# Patient Record
Sex: Female | Born: 1950
Health system: Southern US, Community
[De-identification: ages and names within clinical notes are randomized; demographics above are authoritative.]

## PROBLEM LIST (undated history)

## (undated) DIAGNOSIS — K219 Gastro-esophageal reflux disease without esophagitis: Secondary | ICD-10-CM

## (undated) DIAGNOSIS — E785 Hyperlipidemia, unspecified: Secondary | ICD-10-CM

## (undated) DIAGNOSIS — G5602 Carpal tunnel syndrome, left upper limb: Secondary | ICD-10-CM

## (undated) DIAGNOSIS — M1990 Unspecified osteoarthritis, unspecified site: Secondary | ICD-10-CM

## (undated) DIAGNOSIS — F419 Anxiety disorder, unspecified: Secondary | ICD-10-CM

## (undated) DIAGNOSIS — J302 Other seasonal allergic rhinitis: Secondary | ICD-10-CM

## (undated) HISTORY — DX: Anxiety disorder, unspecified: F41.9

## (undated) HISTORY — PX: TONSILLECTOMY: SUR1361

## (undated) HISTORY — DX: Hyperlipidemia, unspecified: E78.5

## (undated) HISTORY — DX: Other seasonal allergic rhinitis: J30.2

## (undated) HISTORY — DX: Carpal tunnel syndrome, left upper limb: G56.02

## (undated) HISTORY — PX: ABDOMINAL HYSTERECTOMY: SHX81

---

## 2001-06-22 ENCOUNTER — Other Ambulatory Visit: Admission: RE | Admit: 2001-06-22 | Discharge: 2001-06-22 | Payer: Self-pay | Admitting: Unknown Physician Specialty

## 2005-02-22 LAB — HM COLONOSCOPY: HM Colonoscopy: NORMAL

## 2010-08-27 HISTORY — PX: HAND SURGERY: SHX662

## 2011-04-01 ENCOUNTER — Emergency Department (HOSPITAL_COMMUNITY)
Admission: EM | Admit: 2011-04-01 | Discharge: 2011-04-02 | Disposition: A | Discharge status: Home Health | Payer: 59 | Attending: Emergency Medicine | Admitting: Emergency Medicine

## 2011-04-01 ENCOUNTER — Encounter (HOSPITAL_COMMUNITY): Payer: Self-pay | Admitting: Emergency Medicine

## 2011-04-01 DIAGNOSIS — R059 Cough, unspecified: Secondary | ICD-10-CM | POA: Insufficient documentation

## 2011-04-01 DIAGNOSIS — H9319 Tinnitus, unspecified ear: Secondary | ICD-10-CM | POA: Insufficient documentation

## 2011-04-01 DIAGNOSIS — H539 Unspecified visual disturbance: Secondary | ICD-10-CM | POA: Insufficient documentation

## 2011-04-01 DIAGNOSIS — H43391 Other vitreous opacities, right eye: Secondary | ICD-10-CM

## 2011-04-01 DIAGNOSIS — R05 Cough: Secondary | ICD-10-CM | POA: Insufficient documentation

## 2011-04-01 DIAGNOSIS — Z90710 Acquired absence of both cervix and uterus: Secondary | ICD-10-CM | POA: Insufficient documentation

## 2011-04-01 NOTE — Discharge Instructions (Signed)
Eye Floaters A jelly-like fluid fills the inside of the eye and is called the vitreous. The vitreous is normally clear. It allows light to pass through to the back of the eye to the tissues that contain the nerves needed for vision (the retina). With age, the vitreous can start to decline. If a decline happens, specks of material from clumps of cells, blood, or other materials may start to float around inside the eye. These objects cast shadows on the retina. These shadows are seen as moving strings, streaks, "bugs," dust or spider webs floating in front of the eye. CAUSES   Age.   A high degree of near-sightedness (high myopia).   Tears in the retina.   Bleeding inside the eye from broken retinal blood vessels as a result of disease (diabetes, inflammation of the retinal blood vessels, and others).   Blood clot of the major vein of the retina or its branches (retinal vein occlusion).   Trauma.   Retinal detachment.   Vitreous detachment.   Eye surgery.   Inflammation inside the eye (uveitis).   Infection inside the eye.  SYMPTOMS   Seeing floating specs, dots or spider webs in the vision of one eye. This can sometimes be associated with flashes of light seen off to the side.   Bleeding in the eye may begin as floaters and lead to complete vision loss as the vitreous fills with blood. This may happen repeatedly in certain diseases of the blood vessels of the retina (e.g. diabetes).   If the vitreous shrinks enough to pull away from the retina (posterior vitreous detachment), a small circular ring-shaped floater may be seen.  Migraine headaches may be associated with many forms of visual symptoms (sparkling dots, wavy lines) just before the headache strikes. These symptoms due to migraine are not from floaters. They will disappear when the headache goes away. DIAGNOSIS  An eye professional can tell you if you have floaters during an eye exam. TREATMENT  There is no treatment for  the floaters themselves.  If the floaters are due to a tear in the retina, a retinal detachment or other eye disease, the condition that caused the floaters must be treated.   Floaters due to blood in the eye often go away or lessen with time.  SEEK MEDICAL CARE IF:   You suddenly see floating dots or spider webs in front of the vision of one or both eyes. This is especially true if you also see flashes of light off to the side (like flashes of lightening).   You see floaters and also notice a change or drop in your vision in either eye.  Document Released: 01/15/2003 Document Revised: 01/01/2011 Document Reviewed: 05/12/2007 [facility] Patient Information 2012 [place], [place].Vitreous Detachment Most of the eye's interior is filled with vitreous. This is a clear, gel-like substance that helps the eye maintain a round shape. There are millions of fine fibers intertwined within the vitreous that are attached to the surface of the retina (eye's light-sensitive tissue). As we age, the vitreous slowly shrinks, and these fine fibers pull on the retinal surface. Usually the fibers break, allowing the vitreous to separate and shrink away from the retina. This is called a vitreous detachment. In most cases, a vitreous detachment is not sight-threatening. It requires no treatment. A vitreous detachment is a common condition that usually affects people over age 50. It is even more common after age 76. People who are nearsighted are also at increased risk. Those who  have a vitreous detachment in one eye are likely to have one in the other, but it may not happen until years later. SYMPTOMS  As the vitreous shrinks, it becomes somewhat stringy. These strands can cast tiny shadows on the retina. They are seen as "cobwebs" or specks that seem to float about in your field of vision (floaters). If you try to look at these shadows, they appear to quickly dart out of the way. One symptom of a vitreous detachment is a  small, sudden increase in the number of new floaters. This increase in floaters may be accompanied by flashes of light (lightning streaks) in your side (peripheral) vision. In most cases, either you will not notice a vitreous detachment, or you will find it merely annoying due to the increase in floaters. If the vitreous pulls away from the very back of the eye where it is attached to the optic nerve, you may see a small round circle floating within your visual field. This is from the ring of fiber that held the vitreous to the edges of the optic nerve, but has been pulled away. TREATMENT  A vitreous detachment itself does not threaten sight. Occasionally, however, some of the vitreous fibers pull so hard on the retina that they create a hole or tear in the retina. This may lead to a serious condition called a retinal detachment. If the hole occurs at the very back of the eye where all of the light for detailed vision is focused (macula), the ability to see detail clearly may be lost. Both of these conditions are sight-threatening and should be evaluated immediately.  DIAGNOSIS  If the vitreous detachment has led to a retinal tear, hole, or a detached retina, early treatment can help prevent loss of vision. However, macular holes may not be treatable. If left untreated, a detached retina can lead to permanent vision loss in the affected eye. Those who experience a sudden increase in floaters or an increase in flashes of light in peripheral vision should have an eye care specialist examine their eyes as soon as possible. If a portion of your visual field in one eye is completely black, like a curtain, you may have had a retinal detachment. The only way to diagnose the cause of the problem is to have a complete dilated eye exam (medicines are put in the eye to make the black circle, the pupil, larger) by an eye specialist. Document Released: 01/15/2003 Document Revised: 01/01/2011 Document Reviewed:  12/03/2008 [facility] Patient Information 2012 [place], [place].

## 2011-04-01 NOTE — ED Notes (Signed)
Patient reports that she had a cold since past Wednesday;. Around 1900 tonight, patient saw flashing lights in the side of her right eye.  Reports flashing of light in a "arch shape".  Patient referred here from PCP to rule out possible retinal detachment.

## 2011-04-01 NOTE — ED Notes (Signed)
EDP at bedside  

## 2011-04-01 NOTE — ED Provider Notes (Signed)
History     CSN: [id number]  Arrival date & time 04/01/11  2220   First MD Initiated Contact with Patient 04/01/11 2322      No chief complaint on file.   (Consider location/radiation/quality/duration/timing/severity/associated sxs/prior treatment) The history is provided by the patient.   patient has had a cough for the last few days. She states around 7:00 tonight she began seeing some flashing lights in the upper outer vision of her right eye. It is in an arch-shaped. She states she was sent in by her doctor to rule out retinal detachment. She had some floaters in the right eye yesterday, but those are resolved. No vision changes. No eye pain. She wears reading glasses. No headaches.  Past Medical History  Diagnosis Date  . Tinnitus     Past Surgical History  Procedure Date  . Hand surgery   . Abdominal hysterectomy   . Cesarean section   . Tonsillectomy     History reviewed. No pertinent family history.  History  Substance Use Topics  . Smoking status: Never Smoker   . Smokeless tobacco: Not on file  . Alcohol Use: No    OB History    Grav Para Term Preterm Abortions TAB SAB Ect Mult Living                  Review of Systems  Constitutional: Negative for fatigue.  HENT: Negative for hearing loss, neck pain and neck stiffness.   Eyes: Positive for visual disturbance. Negative for photophobia, pain, discharge, redness and itching.  Respiratory: Positive for cough.     Allergies  Review of patient's allergies indicates no known allergies.  Home Medications   Current Outpatient Rx  Name Route Sig Dispense Refill  . ALPRAZOLAM 0.5 MG PO TABS Oral Take 0.25 mg by mouth 2 (two) times daily.    . AZITHROMYCIN 250 MG PO TABS Oral Take 250 mg by mouth daily. Take until 04/03/11    . BIOTIN PO Oral Take 600 mcg by mouth daily.    Marland Kitchen VITAMIN D3 1000 UNITS PO TABS Oral Take 1,000 Units by mouth daily.    Marland Kitchen CLOBETASOL PROPIONATE 0.05 % EX SOLN Topical Apply 1  application topically 2 (two) times a week.    Marland Kitchen ESTRADIOL 0.1 MG/GM VA CREA Vaginal Place 2 g vaginally daily.    Marland Kitchen HYDROCODONE-HOMATROPINE 5-1.5 MG/5ML PO SYRP Oral Take 5 mLs by mouth at bedtime as needed. For cough    . METRONIDAZOLE 0.75 % EX CREA Topical Apply 1 application topically 2 (two) times daily. To face    . ADULT MULTIVITAMIN W/MINERALS CH Oral Take 1 tablet by mouth daily.    . OMEGA-3-ACID ETHYL ESTERS 1 G PO CAPS Oral Take 4 g by mouth daily.    . RED YEAST RICE 600 MG PO CAPS Oral Take 1 capsule by mouth daily.    Marland Kitchen LIPO-FLAVONOID PLUS PO Oral Take 3 tablets by mouth daily.      BP 162/88  Pulse 88  Temp(Src) 98.5 F (36.9 C) (Oral)  Resp 16  SpO2 98%  Physical Exam  Constitutional: She appears well-developed.  HENT:  Head: Normocephalic and atraumatic.  Eyes: Conjunctivae are normal. Pupils are equal, round, and reactive to light. Right eye exhibits no discharge. Left eye exhibits no discharge.       Visual fields intact bilaterally to confrontation. Normal visual acuity.  Neck: Normal range of motion. Neck supple.  Cardiovascular: Normal rate.   Pulmonary/Chest: Effort normal.  ED Course  Procedures (including critical care time)  Labs Reviewed - No data to display No results found.   1. Flashers or floaters of right eye       MDM  Patient with flashes in her right eye. No vision change. Normal visual acuity. Discuss with ophthalmology, Dr. Gwen Pounds. He will see her in the office at 10:30 tomorrow. Patient was given instructions of what she needs to watch for to return to the ER. She was discharged home. Doubt severe retinal attachment this time.        Connie Colon. Rubin Payor, MD 04/02/11 1478

## 2011-04-01 NOTE — ED Notes (Signed)
Patient reports that she had a cold since past Wednesday; patient reports violent cough.  Around 1900 tonight, patient saw flashing lights in the side of her right eye.  Reports flashing of light in a "arch shape".  Patient referred here from PCP to rule out possible retinal detachment.

## 2012-03-26 ENCOUNTER — Encounter: Payer: Self-pay | Admitting: *Deleted

## 2012-04-09 ENCOUNTER — Encounter: Payer: Self-pay | Admitting: *Deleted

## 2012-04-14 ENCOUNTER — Ambulatory Visit (INDEPENDENT_AMBULATORY_CARE_PROVIDER_SITE_OTHER): Payer: 59 | Admitting: Family Medicine

## 2012-04-14 ENCOUNTER — Encounter: Payer: Self-pay | Admitting: Family Medicine

## 2012-04-14 VITALS — BP 120/70 | HR 70 | Wt 142.0 lb

## 2012-04-14 DIAGNOSIS — J309 Allergic rhinitis, unspecified: Secondary | ICD-10-CM

## 2012-04-14 DIAGNOSIS — L719 Rosacea, unspecified: Secondary | ICD-10-CM | POA: Insufficient documentation

## 2012-04-14 DIAGNOSIS — E785 Hyperlipidemia, unspecified: Secondary | ICD-10-CM

## 2012-04-14 MED ORDER — METRONIDAZOLE 0.75 % EX CREA: 1.0000 "application " | TOPICAL_CREAM | Freq: Two times a day (BID) | CUTANEOUS | Status: DC

## 2012-04-14 NOTE — Patient Instructions (Addendum)
Call dermatologist and set up appointment

## 2012-04-14 NOTE — Progress Notes (Signed)
  Subjective:    Patient ID: Connie Colon, female    DOB: 1950-01-31, 62 y.o.   MRN: [id number]  Hand Pain   Rash Associated symptoms include fatigue.   Patient arrives office with multiple concerns. Patient notes hand pain. Has developed some Dupuytren's contractures over time. He seemed to be worsening. Patient has a history of low vitamin D. She claims compliance with vitamin D supplement. Not exercising much these days. Admits to ongoing anxiety with insomnia at times. Uses on aprazolam. on a when necessary basis for this. Has a history of rosacea. Flares up at times. Requests a prescription for metronidazole lotion. Notes that it has helped in the past. Also would like to names of some dermatologists to potentially C.   Review of Systems  Constitutional: Positive for fatigue.  Endocrine: Positive for cold intolerance.  Skin: Positive for rash.  All other systems reviewed and are negative.       Objective:   Physical Exam  Vitals reviewed. Constitutional: She appears well-developed and well-nourished.  HENT:  Head: Normocephalic and atraumatic.  Face does reveal mild changes of rosacea  Eyes: Conjunctivae are normal. Pupils are equal, round, and reactive to light.  Neck: Normal range of motion. Neck supple.  Cardiovascular: Normal rate and regular rhythm.   Pulmonary/Chest: Effort normal and breath sounds normal.  Musculoskeletal: Normal range of motion.  Hands revealed duputyren contractures          Assessment & Plan:  Impression #1 chronic contractures of the hands somewhat worse. Not bad enough to cause trigger finger yet. #2 rosacea stable and using medicine. #3 hyperlipidemia status uncertain. #4 anxiety with element and insomnia. Plan metronidazole refilled. Given name of Dr. dermatologist for general exam. Check appropriate blood work including lipid panel. Also glucose. And vitamin D with history of low vitamin D.

## 2012-04-25 LAB — LIPID PANEL
Cholesterol: 170 mg/dL (ref 0–200)
HDL: 37 mg/dL — ABNORMAL LOW (ref >39)
Total CHOL/HDL Ratio: 4.6 Ratio
VLDL: 34 mg/dL (ref 0–40)

## 2012-04-27 ENCOUNTER — Encounter: Payer: Self-pay | Admitting: Family Medicine

## 2012-08-26 ENCOUNTER — Other Ambulatory Visit: Payer: Self-pay | Admitting: Family Medicine

## 2012-08-26 NOTE — Telephone Encounter (Signed)
Ok three monthly refill

## 2012-12-27 ENCOUNTER — Other Ambulatory Visit: Payer: Self-pay | Admitting: Family Medicine

## 2012-12-30 ENCOUNTER — Other Ambulatory Visit: Payer: Self-pay | Admitting: *Deleted

## 2012-12-30 MED ORDER — ALPRAZOLAM 0.5 MG PO TABS: ORAL_TABLET | ORAL | Status: DC

## 2013-01-01 NOTE — Telephone Encounter (Signed)
May refill x2 ?

## 2013-05-25 ENCOUNTER — Telehealth: Payer: Self-pay | Admitting: Family Medicine

## 2013-05-25 DIAGNOSIS — Z79899 Other long term (current) drug therapy: Secondary | ICD-10-CM

## 2013-05-25 DIAGNOSIS — E785 Hyperlipidemia, unspecified: Secondary | ICD-10-CM

## 2013-05-25 NOTE — Telephone Encounter (Signed)
Patient needs order for blood work. °

## 2013-05-26 NOTE — Telephone Encounter (Signed)
bloodwork orders ready. Pt notified.  

## 2013-05-26 NOTE — Telephone Encounter (Signed)
Lip liv m7 

## 2013-05-30 LAB — HEPATIC FUNCTION PANEL
ALK PHOS: 73 U/L (ref 39–117)
ALT: 15 U/L (ref 0–35)
AST: 20 U/L (ref 0–37)
Albumin: 4.3 g/dL (ref 3.5–5.2)
BILIRUBIN INDIRECT: 0.7 mg/dL (ref 0.2–1.2)
Bilirubin, Direct: 0.1 mg/dL (ref 0.0–0.3)
TOTAL PROTEIN: 7 g/dL (ref 6.0–8.3)
Total Bilirubin: 0.8 mg/dL (ref 0.2–1.2)

## 2013-05-30 LAB — BASIC METABOLIC PANEL
BUN: 14 mg/dL (ref 6–23)
CO2: 30 mEq/L (ref 19–32)
CREATININE: 0.7 mg/dL (ref 0.50–1.10)
Calcium: 9.6 mg/dL (ref 8.4–10.5)
Chloride: 101 mEq/L (ref 96–112)
Glucose, Bld: 89 mg/dL (ref 70–99)
Potassium: 4.7 mEq/L (ref 3.5–5.3)
Sodium: 137 mEq/L (ref 135–145)

## 2013-05-30 LAB — LIPID PANEL
CHOLESTEROL: 187 mg/dL (ref 0–200)
HDL: 37 mg/dL — ABNORMAL LOW (ref >39)
LDL Cholesterol: 112 mg/dL — ABNORMAL HIGH (ref 0–99)
TRIGLYCERIDES: 192 mg/dL — AB (ref <150)
Total CHOL/HDL Ratio: 5.1 Ratio
VLDL: 38 mg/dL (ref 0–40)

## 2013-06-06 ENCOUNTER — Encounter: Payer: Self-pay | Admitting: Family Medicine

## 2013-06-06 ENCOUNTER — Ambulatory Visit: Payer: 59 | Admitting: Family Medicine

## 2013-06-06 ENCOUNTER — Ambulatory Visit (INDEPENDENT_AMBULATORY_CARE_PROVIDER_SITE_OTHER): Payer: 59 | Admitting: Family Medicine

## 2013-06-06 VITALS — BP 124/86 | Ht 61.0 in | Wt 133.0 lb

## 2013-06-06 DIAGNOSIS — Z23 Encounter for immunization: Secondary | ICD-10-CM

## 2013-06-06 DIAGNOSIS — J309 Allergic rhinitis, unspecified: Secondary | ICD-10-CM

## 2013-06-06 DIAGNOSIS — F411 Generalized anxiety disorder: Secondary | ICD-10-CM

## 2013-06-06 DIAGNOSIS — E785 Hyperlipidemia, unspecified: Secondary | ICD-10-CM

## 2013-06-06 MED ORDER — HYDROCORTISONE 1 % EX LOTN: TOPICAL_LOTION | CUTANEOUS | Status: DC

## 2013-06-06 NOTE — Progress Notes (Signed)
   Subjective:    Patient ID: Connie Colon, female    DOB: 1950-04-18, 63 y.o.   MRN: [id number]  Hyperlipidemia This is a chronic problem. The current episode started more than 1 year ago.  Exercises 5 days a week. Follow a healthy diet. Ex five times per wk with stat bicycle, keeps grandkids well Had bloodwork done on May 5th.  Results for orders placed in visit on 05/25/13  LIPID PANEL      Result Value Ref Range   Cholesterol 187  0 - 200 mg/dL   Triglycerides 192 (*) <150 mg/dL   HDL 37 (*) >39 mg/dL   Total CHOL/HDL Ratio 5.1     VLDL 38  0 - 40 mg/dL   LDL Cholesterol 112 (*) 0 - 99 mg/dL  HEPATIC FUNCTION PANEL      Result Value Ref Range   Total Bilirubin 0.8  0.2 - 1.2 mg/dL   Bilirubin, Direct 0.1  0.0 - 0.3 mg/dL   Indirect Bilirubin 0.7  0.2 - 1.2 mg/dL   Alkaline Phosphatase 73  39 - 117 U/L   AST 20  0 - 37 U/L   ALT 15  0 - 35 U/L   Total Protein 7.0  6.0 - 8.3 g/dL   Albumin 4.3  3.5 - 5.2 g/dL  BASIC METABOLIC PANEL      Result Value Ref Range   Sodium 137  135 - 145 mEq/L   Potassium 4.7  3.5 - 5.3 mEq/L   Chloride 101  96 - 112 mEq/L   CO2 30  19 - 32 mEq/L   Glucose, Bld 89  70 - 99 mg/dL   BUN 14  6 - 23 mg/dL   Creat 0.70  0.50 - 1.10 mg/dL   Calcium 9.6  8.4 - 10.5 mg/dL   BP numbers often good, generally numbers are low  Check ears. Ears have been feeling full. Taking allergy meds. Hx of tinnitus long term, took allergy pill.  Back is itchy/dry skin. Uses lotion.  Worse in winter year long, applies lotion on the back. Bad itching at times  Discuss getting pneumonia and tetanus vaccine. prob more tet shot than twenty yrs  Knot  On left leg. Noticied it in December. Not painful.  No hx of injury  On red yeat rice extract and fish oil. This is the lovaza  History of anxiety and insomnia. Uses a Presalin when necessary for this. Overall definitely helps.  Compliant with both Claritin and Flonase for allergies. Controls him well. Review of  Systems No chest pain no headache no back pain no abdominal pain no change in bowel habits no blood in stool    Objective:   Physical Exam Alert no apparent distress blood pressure good on repeat. H&T normal. Lungs clear. Heart regular in rhythm. Ankles without edema moderate nasal congestion. Skin somewhat scaly on the back. Within normal limits for simply dry skin       Assessment & Plan:  Impression 1 allergic rhinitis clinically stable discussed. #2 hyperlipidemia numbers decent on current supplement. Not enough to warrant prescription medications. #3 insomnia discussed. #4 scaly dry skin on back nonspecific. Plan pneumonia vaccine. Tetanus vaccine. Meds refilled. Diet exercise discussed. Check every 6 months. WSL

## 2013-06-26 ENCOUNTER — Other Ambulatory Visit: Payer: Self-pay | Admitting: Family Medicine

## 2013-06-26 NOTE — Telephone Encounter (Signed)
Ok plus three revf

## 2013-07-06 ENCOUNTER — Encounter: Payer: Self-pay | Admitting: Family Medicine

## 2013-07-06 ENCOUNTER — Ambulatory Visit (INDEPENDENT_AMBULATORY_CARE_PROVIDER_SITE_OTHER): Payer: 59 | Admitting: Family Medicine

## 2013-07-06 VITALS — BP 124/80 | Ht 61.0 in | Wt 132.0 lb

## 2013-07-06 DIAGNOSIS — B9789 Other viral agents as the cause of diseases classified elsewhere: Secondary | ICD-10-CM

## 2013-07-06 DIAGNOSIS — B348 Other viral infections of unspecified site: Secondary | ICD-10-CM

## 2013-07-06 DIAGNOSIS — B338 Other specified viral diseases: Secondary | ICD-10-CM

## 2013-07-06 DIAGNOSIS — B349 Viral infection, unspecified: Secondary | ICD-10-CM

## 2013-07-06 NOTE — Progress Notes (Signed)
   Subjective:    Patient ID: Connie Colon, female    DOB: 28-Feb-1950, 63 y.o.   MRN: [id number]  Sore Throat  This is a new problem. The maximum temperature recorded prior to her arrival was 102 - 102.9 F. Associated symptoms include coughing, ear pain and headaches. Associated symptoms comments: Body aches.    She did have high fever over the weekend granddaughter had something similar patient states she's getting better but occasional fever occurs  Review of Systems  HENT: Positive for ear pain.   Respiratory: Positive for cough.   Neurological: Positive for headaches.       Objective:   Physical Exam Ears normal nostrils normal throat normal neck supple lungs clear heart regular       Assessment & Plan:  Upper respiratory illness/possible parainfluenza virus-seemingly getting a little bit better. I would recommend that the patient does give this a little more time and it should gradually get better certainly followup if ongoing troubles  No need for antibiotics currently call back if sinus symptoms occur

## 2013-10-24 ENCOUNTER — Other Ambulatory Visit: Payer: Self-pay | Admitting: Family Medicine

## 2013-10-25 NOTE — Telephone Encounter (Signed)
Ok plus 2 ref 

## 2013-12-01 ENCOUNTER — Encounter: Payer: Self-pay | Admitting: Family Medicine

## 2013-12-01 ENCOUNTER — Ambulatory Visit (INDEPENDENT_AMBULATORY_CARE_PROVIDER_SITE_OTHER): Payer: 59 | Admitting: Family Medicine

## 2013-12-01 VITALS — BP 140/90 | Temp 98.3°F | Ht 61.0 in | Wt 135.0 lb

## 2013-12-01 DIAGNOSIS — M546 Pain in thoracic spine: Secondary | ICD-10-CM

## 2013-12-01 DIAGNOSIS — K219 Gastro-esophageal reflux disease without esophagitis: Secondary | ICD-10-CM

## 2013-12-01 MED ORDER — CHLORZOXAZONE 500 MG PO TABS: 500.0000 mg | ORAL_TABLET | Freq: Three times a day (TID) | ORAL | Status: DC | PRN

## 2013-12-01 MED ORDER — PANTOPRAZOLE SODIUM 40 MG PO TBEC: 40.0000 mg | DELAYED_RELEASE_TABLET | Freq: Every day | ORAL | Status: DC

## 2013-12-01 NOTE — Patient Instructions (Signed)
Ibuprofen take two tablets three times per day with one of the muscle spasm med

## 2013-12-01 NOTE — Progress Notes (Signed)
   Subjective:    Patient ID: Connie Colon, female    DOB: 10-01-1950, 63 y.o.   MRN: [id number]  Back Pain This is a new problem. Episode onset: Wednesday night (woke from sleep) The problem occurs intermittently. The problem has been gradually improving since onset. The pain is present in the thoracic spine. Quality: dull. The pain does not radiate. The symptoms are aggravated by lying down (leaning back, pressure). Associated symptoms include headaches. (Loss of appetite, indigestion) Treatments tried: Zantac and Tylenol. The treatment provided moderate relief.   Wed night awoke with back pain  Took some tims felt achey and sore  Took zantac and it helped  Felt like needed to burp and felt pressure  Still substantial discomfort in the back  generlly does not have hearburn or reflex  Voice has been hoarse  Patient has been doing a lot of lifting with caretaking of grandchildren.  Patient is right-handed.   Diminished appetite and energy  Seems more frontal headaches  Elbows often ache   Review of Systems  Musculoskeletal: Positive for back pain.  Neurological: Positive for headaches.  no vomiting no diarrhea slight nausea no rash ROS otherwise negative     Objective:   Physical Exam  Alert no apparent distress. Lungs clear. Heart regular rate and rhythm. HEENT normal. Shoulder good range of motion no tenderness. Some periscapular tenderness to deep palpation. Mild epigastric tenderness. No rebound no guarding good bowel sounds abdomen generally benign  EKG normal sinus rhythm nonspecific ST-T changes    Assessment & Plan:  Impression 1 progressive reflux discussed #2 periscapular strain likely diagnosis. Often can cause a deep ache. Discussed with patient plan increase from Zantac to Protonix. Add chlorzoxazone to 2 Advil 3 times a day. Follow-up in several weeks. Warning signs discussed. WSL

## 2013-12-02 DIAGNOSIS — K219 Gastro-esophageal reflux disease without esophagitis: Secondary | ICD-10-CM | POA: Insufficient documentation

## 2013-12-13 ENCOUNTER — Encounter: Payer: Self-pay | Admitting: Family Medicine

## 2013-12-28 ENCOUNTER — Ambulatory Visit (INDEPENDENT_AMBULATORY_CARE_PROVIDER_SITE_OTHER): Payer: 59 | Admitting: Family Medicine

## 2013-12-28 ENCOUNTER — Encounter: Payer: Self-pay | Admitting: Family Medicine

## 2013-12-28 VITALS — BP 138/80 | Ht 61.0 in | Wt 135.0 lb

## 2013-12-28 DIAGNOSIS — J3089 Other allergic rhinitis: Secondary | ICD-10-CM

## 2013-12-28 DIAGNOSIS — K219 Gastro-esophageal reflux disease without esophagitis: Secondary | ICD-10-CM

## 2013-12-28 MED ORDER — AMOXICILLIN 500 MG PO CAPS: 500.0000 mg | ORAL_CAPSULE | Freq: Three times a day (TID) | ORAL | Status: DC

## 2013-12-28 MED ORDER — FLUTICASONE PROPIONATE 50 MCG/ACT NA SUSP: 2.0000 | Freq: Every day | NASAL | Status: DC

## 2013-12-28 NOTE — Progress Notes (Signed)
   Subjective:    Patient ID: Connie Colon, female    DOB: 1950-11-22, 63 y.o.   MRN: [id number]  HPI Comments: Back pain is gone  Gastrophageal Reflux She complains of abdominal pain, belching and a hoarse voice. drainage in her throat. This is a recurrent problem. Episode onset: was seen here on 11/23. The problem occurs frequently. The problem has been unchanged. The symptoms are aggravated by certain foods. Treatments tried: protonix. The treatment provided no relief.    Back 95 per cent resolved and much bwtter   cong and drainage in throat  Gets congestion intermittently  Took mucinex for months   Now taking   Still some midepig dis after eating--lasts just for a few seconds.  Voice is better but not completely right   Taking protonix every morning  flonase as needed for sneezing, has not needed. Substantial history of allergies. Allergy workup in the past and in fact for while to shots  Gets fall allergies   Had a difficult viral illness earlier this year. Influenza in nature and since then has had consistent congestion drainage etc.   Review of Systems  HENT: Positive for hoarse voice.   Gastrointestinal: Positive for abdominal pain.   No vomiting no diarrhea no frank headache    Objective:   Physical Exam  Alert mild malaise talkative HEENT moderate his congestion pharynx slight erythema neck supple lungs clear. Heart regular in rhythm.      Assessment & Plan:  Impression 1 reflux ongoing O improved #2 allergic rhinitis chronic discussed at great length. #3 probable element of sinusitis discussed plan diet discussed exercise discussed. Antibiotics prescribed. Continue steroid nasal spray. Maintain treatment for reflux. WSL

## 2014-01-23 ENCOUNTER — Other Ambulatory Visit: Payer: Self-pay | Admitting: Family Medicine

## 2014-01-24 NOTE — Telephone Encounter (Signed)
Seen 12/3

## 2014-01-24 NOTE — Telephone Encounter (Signed)
Ok plus 5 ref 

## 2014-03-05 ENCOUNTER — Telehealth: Payer: Self-pay | Admitting: Family Medicine

## 2014-03-05 NOTE — Telephone Encounter (Signed)
Discussed with patient. Patient advised she can try ranitidine 300 mg daily may or may not work but worth a try. Patient verbalized understanding.

## 2014-03-05 NOTE — Telephone Encounter (Signed)
Pt called wanting to know how long she is suppose to take the  Protonix. Pt read online where it can affect your heart and she is Worried about it. She is wanting to know if she can take something Gu[place]upe Dawn over the counter.

## 2014-03-05 NOTE — Telephone Encounter (Signed)
Pt can try ranitidine 300 mg daily may or may not work but worth a try

## 2014-03-11 ENCOUNTER — Other Ambulatory Visit: Payer: Self-pay | Admitting: Family Medicine

## 2014-06-15 ENCOUNTER — Telehealth: Payer: Self-pay | Admitting: Family Medicine

## 2014-06-15 NOTE — Telephone Encounter (Signed)
Pt is requesting a refill on hycodan. Pt states that she has a bad cough  That is keeping her up at night  [facility] [place].

## 2014-06-15 NOTE — Telephone Encounter (Signed)
Advised pt that med cannot be called in without being seen. Pt last seen December. Offered pt appt today. Pt declined and states she will call back if she wants office visit.

## 2014-06-30 ENCOUNTER — Other Ambulatory Visit: Payer: Self-pay | Admitting: Family Medicine

## 2014-07-02 NOTE — Telephone Encounter (Signed)
Last seen 02/17/13

## 2014-07-21 ENCOUNTER — Other Ambulatory Visit: Payer: Self-pay | Admitting: Family Medicine

## 2014-07-23 NOTE — Telephone Encounter (Signed)
Needs office visit.

## 2014-07-23 NOTE — Telephone Encounter (Signed)
1 refill, will need office visit before additional

## 2014-08-13 ENCOUNTER — Telehealth: Payer: Self-pay | Admitting: Family Medicine

## 2014-08-13 DIAGNOSIS — E785 Hyperlipidemia, unspecified: Secondary | ICD-10-CM

## 2014-08-13 DIAGNOSIS — Z139 Encounter for screening, unspecified: Secondary | ICD-10-CM

## 2014-08-13 DIAGNOSIS — Z79899 Other long term (current) drug therapy: Secondary | ICD-10-CM

## 2014-08-13 NOTE — Telephone Encounter (Signed)
Spoke with patient and informed patient per Dr.Scott that the following labs were ordered: Lipid, Liver, Glucose. Informed patient also that we use [facility]. Patient verbalized understanding.

## 2014-08-13 NOTE — Telephone Encounter (Signed)
Pt called requesting lab orders to be sent over for her appt next week. Last labs per epic were: lipid,hepatic and bmp on 05/30/13

## 2014-08-13 NOTE — Telephone Encounter (Signed)
Lipid liver glucose 

## 2014-08-14 LAB — HEPATIC FUNCTION PANEL
ALBUMIN: 4.5 g/dL (ref 3.6–4.8)
ALK PHOS: 63 IU/L (ref 39–117)
ALT: 12 IU/L (ref 0–32)
AST: 22 IU/L (ref 0–40)
BILIRUBIN TOTAL: 0.6 mg/dL (ref 0.0–1.2)
Bilirubin, Direct: 0.15 mg/dL (ref 0.00–0.40)
Total Protein: 7.3 g/dL (ref 6.0–8.5)

## 2014-08-14 LAB — LIPID PANEL
Chol/HDL Ratio: 4.8 ratio units — ABNORMAL HIGH (ref 0.0–4.4)
Cholesterol, Total: 207 mg/dL — ABNORMAL HIGH (ref 100–199)
HDL: 43 mg/dL (ref >39)
LDL Calculated: 131 mg/dL — ABNORMAL HIGH (ref 0–99)
TRIGLYCERIDES: 165 mg/dL — AB (ref 0–149)
VLDL CHOLESTEROL CAL: 33 mg/dL (ref 5–40)

## 2014-08-14 LAB — GLUCOSE, RANDOM: Glucose: 85 mg/dL (ref 65–99)

## 2014-08-23 ENCOUNTER — Other Ambulatory Visit: Payer: Self-pay | Admitting: Family Medicine

## 2014-08-23 ENCOUNTER — Encounter: Payer: Self-pay | Admitting: Family Medicine

## 2014-08-23 ENCOUNTER — Ambulatory Visit (INDEPENDENT_AMBULATORY_CARE_PROVIDER_SITE_OTHER): Payer: 59 | Admitting: Family Medicine

## 2014-08-23 VITALS — BP 122/74 | Ht 61.0 in | Wt 133.4 lb

## 2014-08-23 DIAGNOSIS — F411 Generalized anxiety disorder: Secondary | ICD-10-CM | POA: Diagnosis not present

## 2014-08-23 DIAGNOSIS — L719 Rosacea, unspecified: Secondary | ICD-10-CM

## 2014-08-23 DIAGNOSIS — E785 Hyperlipidemia, unspecified: Secondary | ICD-10-CM | POA: Diagnosis not present

## 2014-08-23 DIAGNOSIS — J301 Allergic rhinitis due to pollen: Secondary | ICD-10-CM

## 2014-08-23 MED ORDER — FLUTICASONE PROPIONATE 50 MCG/ACT NA SUSP: 2.0000 | Freq: Every day | NASAL | Status: DC

## 2014-08-23 MED ORDER — ESTRADIOL 0.1 MG/GM VA CREA: 1.0000 | TOPICAL_CREAM | VAGINAL | Status: DC

## 2014-08-23 MED ORDER — PANTOPRAZOLE SODIUM 40 MG PO TBEC: DELAYED_RELEASE_TABLET | ORAL | Status: DC

## 2014-08-23 MED ORDER — ALPRAZOLAM 0.5 MG PO TABS: ORAL_TABLET | ORAL | Status: DC

## 2014-08-23 NOTE — Progress Notes (Signed)
   Subjective:    Patient ID: Connie Colon, female    DOB: Sep 22, 1950, 64 y.o.   MRN: [id number]  HPI Patient arrives for a physical-patient states she gets her breast and pelvic exam thru her GYN.    >this  Results for orders placed or performed in visit on 08/23/14  HM COLONOSCOPY  Result Value Ref Range   HM Colonoscopy normal- repeat in 10 years     Patient would like to discuss her reflux and how to better control it and would like to review recent lab work.  Takes one at [facility] for the chl  Eats right, but not as good as lat yr  Ex seven d per wk for two wks  Cut back a bit with left knee pain  Takes on zantac 75 ea nigpain right lat neck momentary, comes off and on  Trying to not eat late, not a caffeine person,  Drinks water   Uses zantac  Only  Had wax in ears   Results for orders placed or performed in visit on 08/23/14  HM COLONOSCOPY  Result Value Ref Range   HM Colonoscopy normal- repeat in 10 years      Review of Systems No headache no chest pain no back pain no abdominal pain no change in bowel habits    Objective:   Physical Exam Alert vitals stable no acute distress. HEENT normal. Lungs clear. Heart regular in rhythm. Abdominal exam benign ankles without edema. Facial skin benign.       Assessment & Plan:  Impression hyperlipidemia numbers improved though still not perfect discussed #2 reflux ongoing patient to continue to try with ranitidine. #3 rosacea clinically stable #4 chronic anxiety stable with medication WSL follow-up as scheduled plan maintain metronidazole. Symptom care discussed diet discussed. Medications refilled.

## 2014-08-24 NOTE — Telephone Encounter (Signed)
Ok plus five ref, thought we did yest

## 2014-10-04 ENCOUNTER — Ambulatory Visit (INDEPENDENT_AMBULATORY_CARE_PROVIDER_SITE_OTHER): Payer: 59 | Admitting: Otolaryngology

## 2014-10-04 DIAGNOSIS — H9313 Tinnitus, bilateral: Secondary | ICD-10-CM | POA: Diagnosis not present

## 2014-10-04 DIAGNOSIS — H6123 Impacted cerumen, bilateral: Secondary | ICD-10-CM | POA: Diagnosis not present

## 2015-03-21 ENCOUNTER — Other Ambulatory Visit: Payer: Self-pay | Admitting: Family Medicine

## 2015-03-22 NOTE — Telephone Encounter (Signed)
Ok plus 2 monthly ref 

## 2015-06-18 ENCOUNTER — Ambulatory Visit (INDEPENDENT_AMBULATORY_CARE_PROVIDER_SITE_OTHER): Payer: 59 | Admitting: Family Medicine

## 2015-06-18 ENCOUNTER — Encounter: Payer: Self-pay | Admitting: Family Medicine

## 2015-06-18 VITALS — BP 116/76 | Ht 61.0 in | Wt 137.5 lb

## 2015-06-18 DIAGNOSIS — K219 Gastro-esophageal reflux disease without esophagitis: Secondary | ICD-10-CM | POA: Diagnosis not present

## 2015-06-18 DIAGNOSIS — F411 Generalized anxiety disorder: Secondary | ICD-10-CM

## 2015-06-18 DIAGNOSIS — G250 Essential tremor: Secondary | ICD-10-CM | POA: Diagnosis not present

## 2015-06-18 MED ORDER — CHLORZOXAZONE 500 MG PO TABS: ORAL_TABLET | ORAL | Status: DC

## 2015-06-18 MED ORDER — ALPRAZOLAM 0.25 MG PO TABS: ORAL_TABLET | ORAL | Status: DC

## 2015-06-18 NOTE — Progress Notes (Signed)
   Subjective:    Patient ID: Connie Colon, female    DOB: [birth year], 65 y.o.   MRN: [id number]  patient arrives for discussion of numerous concerns. HPI Patient in today for involuntary movement of neck muscles.  Has been going on for a couple months  Pt notes neck would "not be still" , on further history etc. Shaking tremor like movement of the head and neck. Patient also notes that in her hands on occasion.  Uses zantac 150 twice per day, helps chronic hoarsenes. Has stopped proton pump inhibitor because she is worried about to strong acid blockade. States Zantac helps. No obvious side effects. Next  Notes chronic anxiety about ear ringing not as big a problem now. Patient notes when she cuts her  A Presalin and half she has less fatigue.  Has been sleeping on three pillows,  Seem to be associated with the development of her neck pain. Is morbid tightness and pain posterior neck left greater than rightand the zanatac helps   Pt notes pain and tenderness  Post neck, worse on right side  Notes tremor in head and neck during the day also  gmo had tremor  Pain not bad in neck more just a tightness   Onset 2 months ago. Has tried neck exercises at home and changing pillows.   Review of Systems No headache, no major weight loss or weight gain, no chest pain no back pain abdominal pain no change in bowel habits complete ROS otherwise negative     Objective:   Physical Exam  alert vitals stable. HEENT neck supple. No posterior lateral tenderness. Lungs clear. Heart regular in rhythm. Neuro exam no bradykinesia. No Caldwell rigidity. Normal facial expression. No cerebellar tremor. Fine movements intact. Positive essential tremor noted      Assessment & Plan:   impression 1 reflux discussed at length safe to maintain Zantac would maintain particularly with chronic hoarseness #2 chronic anxiety ongoing challenge will decrease Xanax to current dose. #3 essential tremor discussed at  length plan trial chlorzoxazone daily at bedtime. Reading information on essential tremor definitely does not need medicines this time. Maintain Zantac. Decrease a Presalin is noted one half 0.25 mg tablet twice a day.

## 2015-06-18 NOTE — Patient Instructions (Signed)
Essential Tremor A tremor is trembling or shaking that you cannot control. Most tremors affect the hands or arms. Tremors can also affect the head, vocal cords, face, and other parts of the body.  Essential tremor is a tremor without a known cause.  CAUSES Essential tremor has no known cause.  RISK FACTORS You may be at greater risk of essential tremor if:   You have a family member with essential tremor.   You are age 64 or older.   You take certain medicines. SIGNS AND SYMPTOMS The main sign of a tremor is uncontrolled and unintentional rhythmic shaking of a body part.  You may have difficulty eating with a spoon or fork.   You may have difficulty writing.   You may nod your head up and down or side to side.   You may have a quivering voice.  Your tremors:  May get worse over time.   May come and go.   May be more noticeable on one side of your body.   May get worse due to stress, fatigue, caffeine, and extreme heat or cold.  DIAGNOSIS Your health care provider can diagnose essential tremor based on your symptoms, medical history, and a physical examination. There is no single test to diagnose an essential tremor. However, your health care provider may perform a variety of tests to rule out other conditions. Tests may include:   Blood and urine tests.   Imaging studies of your brain, such as:   CT scan.   MRI.   A test that measures involuntary muscle movement (electromyogram). TREATMENT Your tremors may go away without treatment. Mild tremors may not need treatment if they do not affect your day-to-day life. Severe tremors may need to be treated using one or a combination of the following options:   Medicines. This may include medicine that is injected.  Lifestyle changes.   Physical therapy.  HOME CARE INSTRUCTIONS  Take medicines only as directed by your health care provider.   Limit alcohol intake to no more than 1 drink per day for  nonpregnant women and 2 drinks per day for men. One drink equals 12 oz of beer, 5 oz of wine, or 1 oz of hard liquor.  Do not use any tobacco products, including cigarettes, chewing tobacco, or electronic cigarettes. If you need help quitting, ask your health care provider.  Take medicines only as directed by your health care provider.   Avoid extreme heat or cold.   Limit the amount of caffeine you consumeas directed by your health care provider.   Try to get eight hours of sleep each night.  Find ways to manage your stress, such as meditation or yoga.  Keep all follow-up visits as directed by your health care provider. This is important. This includes any physical therapy visits. SEEK MEDICAL CARE IF:  You experience any changes in the location or intensity of your tremors.   You start having a tremor after starting a new medicine.   You have tremor with other symptoms such as:   Numbness.   Tingling.   Pain.   Weakness.   Your tremor gets worse.   Your tremor interferes with your daily life.    This information is not intended to replace advice given to you by your health care provider. Make sure you discuss any questions you have with your health care provider.   Document Released: 02/02/2014 Document Reviewed: 02/02/2014 Elsevier Interactive Patient Education 2016 Elsevier Inc.  

## 2015-07-04 ENCOUNTER — Telehealth: Payer: Self-pay | Admitting: Family Medicine

## 2015-07-04 NOTE — Telephone Encounter (Signed)
Patient was prescribed to take her muscle relaxer and a half xanax in the morning to help her tremors.  She says about 6 pm it seems like the muscle relaxer and xanax are running out and she starts having more tremors.   She says she has started to take a whole xanax instead of half in the evening to help calm her down and it helps her.    She wants to know if Dr. Richardson Landry can change the quantity of her xanax to 45 instead of 30 each month, because she is taking 1 1/2 everyday.  [facility]

## 2015-07-04 NOTE — Telephone Encounter (Signed)
Ok may change

## 2015-07-04 NOTE — Telephone Encounter (Signed)
Inform patient Dr. Richardson Landry will see this message on Friday

## 2015-07-05 MED ORDER — ALPRAZOLAM 0.25 MG PO TABS: ORAL_TABLET | ORAL | Status: DC

## 2015-07-05 NOTE — Telephone Encounter (Signed)
Notified patient script will be faxed to pharmacy today.  

## 2015-07-25 ENCOUNTER — Other Ambulatory Visit (INDEPENDENT_AMBULATORY_CARE_PROVIDER_SITE_OTHER): Payer: Self-pay | Admitting: *Deleted

## 2015-07-25 DIAGNOSIS — Z1211 Encounter for screening for malignant neoplasm of colon: Secondary | ICD-10-CM

## 2015-08-08 ENCOUNTER — Telehealth: Payer: Self-pay | Admitting: Family Medicine

## 2015-08-08 ENCOUNTER — Other Ambulatory Visit: Payer: Self-pay | Admitting: *Deleted

## 2015-08-08 MED ORDER — METRONIDAZOLE 0.75 % EX CREA: TOPICAL_CREAM | CUTANEOUS | Status: DC

## 2015-08-08 MED ORDER — ALPRAZOLAM 0.25 MG PO TABS: ORAL_TABLET | ORAL | Status: DC

## 2015-08-08 NOTE — Telephone Encounter (Signed)
Ok lets six ref total

## 2015-08-08 NOTE — Telephone Encounter (Signed)
meds sent to pharm. Pt notified.  

## 2015-08-08 NOTE — Telephone Encounter (Signed)
Pt states she use to take xanax 0.25mg  one bid but was decreased to one half in the am and one qhs because she doesn't like taking medicine. Pt now wants to go back to one bid because it worked better and wants refill on metronidazole for rosacea.

## 2015-08-08 NOTE — Telephone Encounter (Signed)
Pt called wanting to know if her xanax can be changed back to 2.5 twice a day instead of what she is taking now. Also pt is requesting a refill on her metroNIDAZOLE (METROCREAM) 0.75 % cream.      [facility]

## 2015-08-09 ENCOUNTER — Other Ambulatory Visit: Payer: Self-pay | Admitting: Family Medicine

## 2015-08-09 NOTE — Telephone Encounter (Signed)
May have this and 4 refills 

## 2015-08-13 ENCOUNTER — Telehealth: Payer: Self-pay | Admitting: Family Medicine

## 2015-08-13 MED ORDER — ALPRAZOLAM 0.25 MG PO TABS: ORAL_TABLET | ORAL | Status: DC

## 2015-08-13 NOTE — Telephone Encounter (Signed)
Patient states that we were going to call in her alprazolam .25 mg with 60 tablets to [facility]. Pharmacy states that we called in the normal 45 tablets and they cant be filled until August. She took her last pill today and she is hoping we can correct the Rx that was sent in to 60 tablets so that she can have 1 in the morning and 1 at night. She took her last pill today. Please advise.

## 2015-08-13 NOTE — Telephone Encounter (Signed)
Patient states that we were going to call in her alprazolam .25 mg with 60 tablets to [facility].  Pharmacy states that we called in the normal 45 tablets and they cant be filled until August.  She took her last pill today and she is hoping we can correct the Rx that was sent in to 60 tablets so that she can have 1 in the morning and 1 at night.  She took her last pill today.  Please advise.

## 2015-08-13 NOTE — Telephone Encounter (Signed)
Send in #60 1 bid 1 refill needs follow up ov with dr Richardson Landry in the fall

## 2015-08-13 NOTE — Telephone Encounter (Signed)
Prescription to be faxed into pharmacy. Patient notified that medication with increase tablets will be sent in with new directions for 1 tablet BID. Also advise patient follow up needed in fall with Dr.Steve per Dr.Scott. Patient verbalized understanding.

## 2015-09-10 ENCOUNTER — Encounter: Payer: Self-pay | Admitting: Family Medicine

## 2015-09-10 ENCOUNTER — Ambulatory Visit (INDEPENDENT_AMBULATORY_CARE_PROVIDER_SITE_OTHER): Payer: 59 | Admitting: Family Medicine

## 2015-09-10 VITALS — BP 134/86 | Temp 98.5°F | Ht 61.0 in | Wt 137.1 lb

## 2015-09-10 DIAGNOSIS — B349 Viral infection, unspecified: Secondary | ICD-10-CM | POA: Diagnosis not present

## 2015-09-10 DIAGNOSIS — J029 Acute pharyngitis, unspecified: Secondary | ICD-10-CM

## 2015-09-10 LAB — POCT RAPID STREP A (OFFICE): Rapid Strep A Screen: NEGATIVE

## 2015-09-10 MED ORDER — CHLORZOXAZONE 500 MG PO TABS: ORAL_TABLET | ORAL | 5 refills | Status: DC

## 2015-09-10 NOTE — Progress Notes (Signed)
   Subjective:    Patient ID: Connie Colon, female    DOB: Jul 02, 1950, 65 y.o.   MRN: [id number]  Sore Throat   This is a new problem. The current episode started in the past 7 days. The pain is worse on the left side. There has been no fever. She has tried acetaminophen for the symptoms.   Patient would also like to discuss increasing Parafon Forte.takes Games developer and found that a half of the med works better.  Pt finds the muscl spasm med runs out   Has colonoscopy scheduled October 2017 with Dr.Rehman  Results for orders placed or performed in visit on 09/10/15  POCT rapid strep A  Result Value Ref Range   Rapid Strep A Screen Negative Negative    Review of Systems No headache, no major weight loss or weight gain, no chest pain no back pain abdominal pain no change in bowel habits complete ROS otherwise negative     Objective:   Physical Exam   Alert vitals stable, NAD. Blood pressure good on repeat. HEENT normal. Lungs clear. Heart regular rate and rhythm.      Assessment & Plan:  .Impression probable viral syndrome discussed #2 worsening the muscle spasm with need to increase dose discussed will bump up monthly amount of chlorzoxazone. Rationale discussed. WSL

## 2015-10-03 ENCOUNTER — Encounter (INDEPENDENT_AMBULATORY_CARE_PROVIDER_SITE_OTHER): Payer: Self-pay | Admitting: *Deleted

## 2015-10-03 ENCOUNTER — Telehealth (INDEPENDENT_AMBULATORY_CARE_PROVIDER_SITE_OTHER): Payer: Self-pay | Admitting: *Deleted

## 2015-10-03 NOTE — Telephone Encounter (Signed)
Patient needs trilyte 

## 2015-10-04 MED ORDER — PEG 3350-KCL-NA BICARB-NACL 420 G PO SOLR: 4000.0000 mL | Freq: Once | ORAL | 0 refills | Status: AC

## 2015-10-09 ENCOUNTER — Other Ambulatory Visit: Payer: Self-pay | Admitting: Family Medicine

## 2015-10-09 MED ORDER — ALPRAZOLAM 0.25 MG PO TABS: ORAL_TABLET | ORAL | 5 refills | Status: DC

## 2015-10-09 NOTE — Telephone Encounter (Signed)
Six mo ok 

## 2015-10-11 ENCOUNTER — Other Ambulatory Visit: Payer: Self-pay | Admitting: Family Medicine

## 2015-10-11 NOTE — Telephone Encounter (Signed)
Three mo worth 

## 2015-10-16 ENCOUNTER — Telehealth (INDEPENDENT_AMBULATORY_CARE_PROVIDER_SITE_OTHER): Payer: Self-pay | Admitting: *Deleted

## 2015-10-16 NOTE — Telephone Encounter (Signed)
agree

## 2015-10-16 NOTE — Telephone Encounter (Signed)
Referring MD/PCP: steve luking   Procedure: tcs  Reason/Indication:  screening  Has patient had this procedure before?  Yes, 10 yrs ago  If so, when, by whom and where?    Is there a family history of colon cancer?  no  Who?  What age when diagnosed?    Is patient diabetic?   no      Does patient have prosthetic heart valve or mechanical valve?  no  Do you have a pacemaker?  no  Has patient ever had endocarditis? no  Has patient had joint replacement within last 12 months?  no  Does patient tend to be constipated or take laxatives? no  Does patient have a history of alcohol/drug use?  no  Is patient on Coumadin, Plavix and/or Aspirin? no  Medications: alprazolam 0.25 mg 1/2 tab daily, chlorzoxazone 500 mg daily, zantac 150 mg daily, metrocream 0.75%, fish oil 1000 mg bid, red yeast rice 600 mg daily, biotin 600 mcg daily, vit d3 1000 units daily, I-cap vitamin daily, womens daily vitamin, estrace cream  Allergies: nkda  Medication Adjustment:   Procedure date & time: 11/13/15 at 730

## 2015-11-13 ENCOUNTER — Ambulatory Visit (HOSPITAL_COMMUNITY)
Admission: RE | Admit: 2015-11-13 | Discharge: 2015-11-13 | Disposition: A | Discharge status: Home / Self Care | Payer: 59 | Source: Ambulatory Visit | Attending: Internal Medicine | Admitting: Internal Medicine

## 2015-11-13 ENCOUNTER — Encounter (HOSPITAL_COMMUNITY): Payer: Self-pay | Admitting: *Deleted

## 2015-11-13 ENCOUNTER — Encounter (HOSPITAL_COMMUNITY)
Admission: RE | Disposition: A | Discharge status: Home / Self Care | Payer: Self-pay | Source: Ambulatory Visit | Attending: Internal Medicine

## 2015-11-13 DIAGNOSIS — E785 Hyperlipidemia, unspecified: Secondary | ICD-10-CM | POA: Insufficient documentation

## 2015-11-13 DIAGNOSIS — Z79899 Other long term (current) drug therapy: Secondary | ICD-10-CM | POA: Insufficient documentation

## 2015-11-13 DIAGNOSIS — K644 Residual hemorrhoidal skin tags: Secondary | ICD-10-CM | POA: Insufficient documentation

## 2015-11-13 DIAGNOSIS — Z1211 Encounter for screening for malignant neoplasm of colon: Secondary | ICD-10-CM | POA: Diagnosis present

## 2015-11-13 DIAGNOSIS — F419 Anxiety disorder, unspecified: Secondary | ICD-10-CM | POA: Diagnosis not present

## 2015-11-13 DIAGNOSIS — Z79890 Hormone replacement therapy (postmenopausal): Secondary | ICD-10-CM | POA: Diagnosis not present

## 2015-11-13 HISTORY — PX: COLONOSCOPY: SHX5424

## 2015-11-13 SURGERY — COLONOSCOPY
Anesthesia: Moderate Sedation

## 2015-11-13 MED ORDER — MEPERIDINE HCL 50 MG/ML IJ SOLN
INTRAMUSCULAR | Status: DC | PRN
  Administered 2015-11-13 (×2): 25 mg

## 2015-11-13 MED ORDER — STERILE WATER FOR IRRIGATION IR SOLN
Status: DC | PRN
  Administered 2015-11-13: 08:00:00

## 2015-11-13 MED ORDER — PROMETHAZINE HCL 25 MG/ML IJ SOLN
INTRAMUSCULAR | Status: AC
  Filled 2015-11-13: qty 1

## 2015-11-13 MED ORDER — SODIUM CHLORIDE 0.9 % IV SOLN
INTRAVENOUS | Status: DC
  Administered 2015-11-13: 07:00:00 via INTRAVENOUS

## 2015-11-13 MED ORDER — MEPERIDINE HCL 50 MG/ML IJ SOLN
INTRAMUSCULAR | Status: AC
  Filled 2015-11-13: qty 1

## 2015-11-13 MED ORDER — MIDAZOLAM HCL 5 MG/5ML IJ SOLN
INTRAMUSCULAR | Status: DC | PRN
  Administered 2015-11-13 (×4): 2 mg via INTRAVENOUS

## 2015-11-13 MED ORDER — MIDAZOLAM HCL 5 MG/5ML IJ SOLN
INTRAMUSCULAR | Status: AC
  Filled 2015-11-13: qty 10

## 2015-11-13 MED ORDER — PROMETHAZINE HCL 25 MG/ML IJ SOLN
INTRAMUSCULAR | Status: DC | PRN
  Administered 2015-11-13: 12.5 mg via INTRAVENOUS

## 2015-11-13 NOTE — Op Note (Signed)
[facility] Patient Name: Connie Colon Procedure Date: 11/13/2015 7:30 AM MRN: [id number] Date of Birth: [birth year] Attending MD: Hildred Laser , MD CSN: [id number] Age: 65 Admit Type: Outpatient Procedure:                Colonoscopy Indications:              Screening for colorectal malignant neoplasm Providers:                Hildred Laser, MD, Janeece Riggers, RN, Isabella Stalling,                            Technician Referring MD:             Grace Bushy. Wolfgang Phoenix, MD Medicines:                Promethazine 12.5 mg IV, Meperidine 50 mg IV,                            Midazolam 8 mg IV Complications:            No immediate complications. Estimated Blood Loss:     Estimated blood loss: none. Procedure:                Pre-Anesthesia Assessment:                           - Prior to the procedure, a History and Physical                            was performed, and patient medications and                            allergies were reviewed. The patient's tolerance of                            previous anesthesia was also reviewed. The risks                            and benefits of the procedure and the sedation                            options and risks were discussed with the patient.                            All questions were answered, and informed consent                            was obtained. Prior Anticoagulants: The patient has                            taken no previous anticoagulant or antiplatelet                            agents. ASA Grade Assessment: II - A patient with  mild systemic disease. After reviewing the risks                            and benefits, the patient was deemed in                            satisfactory condition to undergo the procedure.                           After obtaining informed consent, the colonoscope                            was passed under direct vision. Throughout the   procedure, the patient's blood pressure, pulse, and                            oxygen saturations were monitored continuously. The                            [id number]) scope was introduced through                            the anus and advanced to the the cecum, identified                            by appendiceal orifice and ileocecal valve. The                            colonoscopy was technically difficult and complex                            due to a tortuous colon. Successful completion of                            the procedure was aided by changing the patient's                            position, withdrawing and reinserting the scope and                            applying abdominal pressure. The patient tolerated                            the procedure well. The quality of the bowel                            preparation was excellent. The ileocecal valve,                            appendiceal orifice, and rectum were photographed. Scope In: [id number] AM Scope Out: 8:09:43 AM Scope Withdrawal Time: 0 hours 6 minutes 34 seconds  Total Procedure Duration: 0 hours 22 minutes 59 seconds  Findings:      The colon (entire examined portion) appeared normal.  External hemorrhoids were found during retroflexion. The hemorrhoids       were small. Impression:               - The entire examined colon is normal.                           - External hemorrhoids.                           - No specimens collected. Moderate Sedation:      Moderate (conscious) sedation was administered by the endoscopy nurse       and supervised by the endoscopist. The following parameters were       monitored: oxygen saturation, heart rate, blood pressure, CO2       capnography and response to care. Total physician intraservice time was       33 minutes. Recommendation:           - Patient has a contact number available for                            emergencies. The signs and symptoms  of potential                            delayed complications were discussed with the                            patient. Return to normal activities tomorrow.                            Written discharge instructions were provided to the                            patient.                           - Resume previous diet today.                           - Continue present medications.                           - Repeat colonoscopy in 10 years for screening                            purposes. Procedure Code(s):        --- Professional ---                           [id number], Colonoscopy, flexible; diagnostic, including                            collection of specimen(s) by brushing or washing,                            when performed (separate procedure)  [id number], Moderate sedation services provided by the                            same physician or other qualified health care                            professional performing the diagnostic or                            therapeutic service that the sedation supports,                            requiring the presence of an independent trained                            observer to assist in the monitoring of the                            patient's level of consciousness and physiological                            status; initial 15 minutes of intraservice time,                            patient age 42 years or older                           [phone], Moderate sedation services; each additional                            15 minutes intraservice time Diagnosis Code(s):        --- Professional ---                           Z12.11, Encounter for screening for malignant                            neoplasm of colon                           K64.4, Residual hemorrhoidal skin tags CPT copyright 2016 American Medical Association. All rights reserved. The codes documented in this report are preliminary and upon coder review  may  be revised to meet current compliance requirements. Hildred Laser, MD Hildred Laser, MD 11/13/2015 8:17:34 AM This report has been signed electronically. Number of Addenda: 0

## 2015-11-13 NOTE — H&P (Signed)
Connie Colon is an 65 y.o. female.   Chief Complaint: Patient is here for colonoscopy. HPI: Patient is 65 year old Caucasian female who is in for screening colonoscopy. She denies change in bowel habits or rectal bleeding. She states she has intermittent right-sided abdominal pain which seemed to occur before and after bowel movement. Pain seemed to get better with exercise. Pain is mild and does not interfere with her activity. She says she's had this pain ever since she had last colonoscopy 10 years ago at [facility]. Family history is negative for CRC.  Past Medical History:  Diagnosis Date  . Anxiety   . Carpal tunnel syndrome of left wrist   . Hyperlipemia   . Seasonal allergies   . Tinnitus     Past Surgical History:  Procedure Laterality Date  . ABDOMINAL HYSTERECTOMY    . CESAREAN SECTION    . HAND SURGERY  08/2010   benign tumor removed Left thumb  . TONSILLECTOMY      Family History  Problem Relation Age of Onset  . Hypertension Mother   . Heart attack Mother   . Hyperlipidemia Mother    Social History:  reports that she has never smoked. She has never used smokeless tobacco. She reports that she does not drink alcohol or use drugs.  Allergies: No Known Allergies  Medications Prior to Admission  Medication Sig Dispense Refill  . ALPRAZolam (XANAX) 0.25 MG tablet TAKE 1 TABLET BY MOUTH TWICE A DAY AS NEEDED FOR ANXIETY (Patient taking differently: TAKE 0.5 TABLET (0.125 MG) BY MOUTH FOUR TIMES DAILY) 60 tablet 2  . BIOTIN PO Take 600 mcg by mouth daily.    . chlorzoxazone (PARAFON) 500 MG tablet Take 1/2 tablet by mouth up to 4 times per day (Patient taking differently: Take 250 mg by mouth 4 (four) times daily. ) 60 tablet 5  . cholecalciferol (VITAMIN D) 1000 UNITS tablet Take 1,000 Units by mouth daily.    Marland Kitchen estradiol (ESTRACE VAGINAL) 0.1 MG/GM vaginal cream Place 1 Applicatorful vaginally once a week. (Patient taking differently: Place 1 Applicatorful vaginally  every Wednesday. ) 42.5 g 5  . hydrocortisone 1 % lotion Apply BID to affected area. (Patient taking differently: Apply 1 application topically 2 (two) times daily as needed for itching. ) 118 mL 2  . metroNIDAZOLE (METROCREAM) 0.75 % cream APPLY TOPICALLY 2 TIMES DAILY TO FACE 45 g 5  . Multiple Vitamin (MULITIVITAMIN WITH MINERALS) TABS Take 1 tablet by mouth daily. Women's One-A-Day    . Multiple Vitamins-Minerals (ICAPS) CAPS Take 1 capsule by mouth daily. Take on every day     . omega-3 acid ethyl esters (LOVAZA) 1 G capsule Take 1 g by mouth 3 (three) times daily.     . ranitidine (ZANTAC) 150 MG tablet Take 150 mg by mouth at bedtime.    . Red Yeast Rice 600 MG CAPS Take 600 mg by mouth every evening.     . TURMERIC CURCUMIN PO Take 1,500 mg by mouth 2 (two) times a week. For shoulder pain.    . fluticasone (FLONASE) 50 MCG/ACT nasal spray Place 2 sprays into both nostrils daily. (Patient not taking: Reported on 09/10/2015) 16 g 5    No results found for this or any previous visit (from the past 48 hour(s)). No results found.  ROS  Blood pressure (!) 144/84, pulse 80, temperature 97.9 F (36.6 C), temperature source Oral, resp. rate 18, height 5\' 1"  (1.549 m), weight 127 lb (57.6 kg), SpO2  97 %. Physical Exam  Constitutional: She appears well-developed and well-nourished.  HENT:  Mouth/Throat: Oropharynx is clear and moist.  Eyes: Conjunctivae are normal. No scleral icterus.  Neck: No thyromegaly present.  Cardiovascular: Normal rate, regular rhythm and normal heart sounds.   No murmur heard. Respiratory: Effort normal and breath sounds normal.  GI: Soft. She exhibits no distension and no mass. There is no tenderness.  Musculoskeletal: She exhibits no edema.  Lymphadenopathy:    She has no cervical adenopathy.  Neurological: She is alert.  Skin: Skin is warm and dry.     Assessment/Plan Average risk screening colonoscopy.  Hildred Laser, MD 11/13/2015, 7:34 AM

## 2015-11-13 NOTE — Discharge Instructions (Signed)
Resume usual medications and diet. No driving for 24 hours. Call if right-sided abdominal pain gets worse. Next screening exam in 10 years.   Colonoscopy, Care After Refer to this sheet in the next few weeks. These instructions provide you with information on caring for yourself after your procedure. Your health care provider may also give you more specific instructions. Your treatment has been planned according to current medical practices, but problems sometimes occur. Call your health care provider if you have any problems or questions after your procedure. WHAT TO EXPECT AFTER THE PROCEDURE  After your procedure, it is typical to have the following:  A small amount of blood in your stool.  Moderate amounts of gas and mild abdominal cramping or bloating. HOME CARE INSTRUCTIONS  Do not drive, operate machinery, or sign important documents for 24 hours.  You may shower and resume your regular physical activities, but move at a slower pace for the first 24 hours.  Take frequent rest periods for the first 24 hours.  Walk around or put a warm pack on your abdomen to help reduce abdominal cramping and bloating.  Drink enough fluids to keep your urine clear or pale yellow.  You may resume your normal diet as instructed by your health care provider. Avoid heavy or fried foods that are hard to digest.  Avoid drinking alcohol for 24 hours or as instructed by your health care provider.  Only take over-the-counter or prescription medicines as directed by your health care provider.  If a tissue sample (biopsy) was taken during your procedure:  Do not take aspirin or blood thinners for 7 days, or as instructed by your health care provider.  Do not drink alcohol for 7 days, or as instructed by your health care provider.  Eat soft foods for the first 24 hours. SEEK MEDICAL CARE IF: You have persistent spotting of blood in your stool 2-3 days after the procedure. SEEK IMMEDIATE MEDICAL  CARE IF:  You have more than a small spotting of blood in your stool.  You pass large blood clots in your stool.  Your abdomen is swollen (distended).  You have nausea or vomiting.  You have a fever.  You have increasing abdominal pain that is not relieved with medicine.   This information is not intended to replace advice given to you by your health care provider. Make sure you discuss any questions you have with your health care provider.   Document Released: 08/27/2003 Document Revised: 11/02/2012 Document Reviewed: 09/19/2012 Elsevier Interactive Patient Education [employer].

## 2015-11-15 ENCOUNTER — Encounter (HOSPITAL_COMMUNITY): Payer: Self-pay | Admitting: Internal Medicine

## 2015-12-11 ENCOUNTER — Telehealth: Payer: Self-pay | Admitting: Family Medicine

## 2015-12-11 DIAGNOSIS — Z79899 Other long term (current) drug therapy: Secondary | ICD-10-CM

## 2015-12-11 DIAGNOSIS — R5383 Other fatigue: Secondary | ICD-10-CM

## 2015-12-11 DIAGNOSIS — E785 Hyperlipidemia, unspecified: Secondary | ICD-10-CM

## 2015-12-11 NOTE — Telephone Encounter (Signed)
Pt is requesting lab orders to be sent over for an upcoming appt. Last labs per epic were: glucose,hepatic,and lipid on 08/13/15

## 2015-12-11 NOTE — Telephone Encounter (Signed)
Lip liv m7 tsh 

## 2015-12-11 NOTE — Telephone Encounter (Signed)
Left message return call 12/11/15

## 2015-12-12 NOTE — Telephone Encounter (Signed)
Spoke with patient and informed her per Dr.Steve Luking- labs were ordered in epic for upcoming appointment. Patient verbalized understanding.

## 2015-12-17 LAB — BASIC METABOLIC PANEL
BUN/Creatinine Ratio: 20 (ref 12–28)
BUN: 13 mg/dL (ref 8–27)
CALCIUM: 9.5 mg/dL (ref 8.7–10.3)
CO2: 25 mmol/L (ref 18–29)
CREATININE: 0.64 mg/dL (ref 0.57–1.00)
Chloride: 101 mmol/L (ref 96–106)
GFR, EST AFRICAN AMERICAN: 109 mL/min/{1.73_m2} (ref >59)
GFR, EST NON AFRICAN AMERICAN: 95 mL/min/{1.73_m2} (ref >59)
Glucose: 96 mg/dL (ref 65–99)
Potassium: 4.9 mmol/L (ref 3.5–5.2)
Sodium: 142 mmol/L (ref 134–144)

## 2015-12-17 LAB — LIPID PANEL
CHOL/HDL RATIO: 5.4 ratio — AB (ref 0.0–4.4)
Cholesterol, Total: 210 mg/dL — ABNORMAL HIGH (ref 100–199)
HDL: 39 mg/dL — AB (ref >39)
LDL CALC: 136 mg/dL — AB (ref 0–99)
TRIGLYCERIDES: 176 mg/dL — AB (ref 0–149)
VLDL Cholesterol Cal: 35 mg/dL (ref 5–40)

## 2015-12-17 LAB — HEPATIC FUNCTION PANEL
ALBUMIN: 4.5 g/dL (ref 3.6–4.8)
ALK PHOS: 74 IU/L (ref 39–117)
ALT: 21 IU/L (ref 0–32)
AST: 19 IU/L (ref 0–40)
BILIRUBIN TOTAL: 0.5 mg/dL (ref 0.0–1.2)
Bilirubin, Direct: 0.1 mg/dL (ref 0.00–0.40)
TOTAL PROTEIN: 7.2 g/dL (ref 6.0–8.5)

## 2015-12-17 LAB — TSH: TSH: 2.52 u[IU]/mL (ref 0.450–4.500)

## 2015-12-24 ENCOUNTER — Ambulatory Visit (INDEPENDENT_AMBULATORY_CARE_PROVIDER_SITE_OTHER): Payer: 59 | Admitting: Family Medicine

## 2015-12-24 ENCOUNTER — Encounter: Payer: Self-pay | Admitting: Family Medicine

## 2015-12-24 VITALS — BP 130/82 | Ht 61.0 in | Wt 136.1 lb

## 2015-12-24 DIAGNOSIS — F411 Generalized anxiety disorder: Secondary | ICD-10-CM | POA: Diagnosis not present

## 2015-12-24 DIAGNOSIS — G250 Essential tremor: Secondary | ICD-10-CM | POA: Diagnosis not present

## 2015-12-24 DIAGNOSIS — K219 Gastro-esophageal reflux disease without esophagitis: Secondary | ICD-10-CM | POA: Diagnosis not present

## 2015-12-24 DIAGNOSIS — E785 Hyperlipidemia, unspecified: Secondary | ICD-10-CM | POA: Diagnosis not present

## 2015-12-24 MED ORDER — CHLORZOXAZONE 500 MG PO TABS: ORAL_TABLET | ORAL | 5 refills | Status: DC

## 2015-12-24 NOTE — Progress Notes (Signed)
Subjective:    Patient ID: Connie Colon, female    DOB: 07-10-1950, 65 y.o.   MRN: [id number] Patient arrives office with numerous concerns HPI Patient is here today for a follow up visit on GERD.Patient states that she has hoarseness from the GERD.chronic hoarseness,  Currently on Zantac twice a day. She did not want to use proton pump inhibitor because of fear of recent side effects and it didn't portray. At the same time she does want you cancer from her chronic reflux. This gives her hoarseness. No tobacco use no alcohol use  Ongoing challenges tremor. Worse with movement in her voice. Worse with fatigue. States that the medication for anxiety also helps it has not worsened has not improved.   Results for orders placed or performed in visit on 12/11/15  TSH  Result Value Ref Range   TSH 2.520 0.450 - 4.500 uIU/mL  Hepatic function panel  Result Value Ref Range   Total Protein 7.2 6.0 - 8.5 g/dL   Albumin 4.5 3.6 - 4.8 g/dL   Bilirubin Total 0.5 0.0 - 1.2 mg/dL   Bilirubin, Direct 0.10 0.00 - 0.40 mg/dL   Alkaline Phosphatase 74 39 - 117 IU/L   AST 19 0 - 40 IU/L   ALT 21 0 - 32 IU/L  Basic metabolic panel  Result Value Ref Range   Glucose 96 65 - 99 mg/dL   BUN 13 8 - 27 mg/dL   Creatinine, Ser 0.64 0.57 - 1.00 mg/dL   GFR calc non Af Amer 95 >59 mL/min/1.73   GFR calc Af Amer 109 >59 mL/min/1.73   BUN/Creatinine Ratio 20 12 - 28   Sodium 142 134 - 144 mmol/L   Potassium 4.9 3.5 - 5.2 mmol/L   Chloride 101 96 - 106 mmol/L   CO2 25 18 - 29 mmol/L   Calcium 9.5 8.7 - 10.3 mg/dL  Lipid panel  Result Value Ref Range   Cholesterol, Total 210 (H) 100 - 199 mg/dL   Triglycerides 176 (H) 0 - 149 mg/dL   HDL 39 (L) >39 mg/dL   VLDL Cholesterol Cal 35 5 - 40 mg/dL   LDL Calculated 136 (H) 0 - 99 mg/dL   Chol/HDL Ratio 5.4 (H) 0.0 - 4.4 ratio units    Patient has left shoulder discomfort that started several years ago.  , Recalls no sudden injury, went to chiropractor had  multiple interventions. Help. Got away from her exercises. Now difficulty with certain movements.    Patient has no other concerns at this time.   Rides the bike six days per wk  Good exsrcise from that  Flu shot has had   using anxiety med one half tab 1id for nerves  Neck a nd back spasms ongoing with ongoing need for meds  chfonic left shouldr pain, worse with certain mtions, going on for five yrs, having ongoing pain with it    Review of Systems    No headache, no major weight loss or weight gain, no chest pain no back pain abdominal pain no change in bowel habits complete ROS otherwise negative  Objective:   Physical Exam  Alert vitals stable, NAD. Blood pressure good on repeat. HEENT normal. Lungs clear. Heart regular rate and rhythm. Left shoulder positive impingement sign strength intact sensation intact distal reflexes intact tremor of the hands noted fine at rest mild currently      Assessment & Plan:  Impression 1 essential tremor discussed maintain same approach currently #2 chronic  anxiety ongoing insubstantial. Patient definitely benefits from meds no excessive sedation #3 left shoulder impingement discussed including Codman's exercises reviewed #4 hyperlipidemia discussed and reviewed. Pt to incr red yeat to 2 daily at bedtime. Plan already is had flu shot. Blood work all reviewed. Medications refilled. Diet exercise discussed. Follow-up in 6 months

## 2016-01-06 ENCOUNTER — Other Ambulatory Visit: Payer: Self-pay | Admitting: Family Medicine

## 2016-01-06 NOTE — Telephone Encounter (Signed)
Dr Mariane Duval pt

## 2016-01-07 NOTE — Telephone Encounter (Signed)
Ok plus 5 monthly ref 

## 2016-01-30 ENCOUNTER — Ambulatory Visit (INDEPENDENT_AMBULATORY_CARE_PROVIDER_SITE_OTHER): Payer: 59 | Admitting: Family Medicine

## 2016-01-30 ENCOUNTER — Encounter: Payer: Self-pay | Admitting: Family Medicine

## 2016-01-30 VITALS — BP 108/80 | Temp 98.6°F | Ht 61.0 in | Wt 131.1 lb

## 2016-01-30 DIAGNOSIS — J209 Acute bronchitis, unspecified: Secondary | ICD-10-CM

## 2016-01-30 DIAGNOSIS — J111 Influenza due to unidentified influenza virus with other respiratory manifestations: Secondary | ICD-10-CM | POA: Diagnosis not present

## 2016-01-30 MED ORDER — HYDROCODONE-HOMATROPINE 5-1.5 MG/5ML PO SYRP: 5.0000 mL | ORAL_SOLUTION | Freq: Four times a day (QID) | ORAL | 0 refills | Status: DC | PRN

## 2016-01-30 MED ORDER — AZITHROMYCIN 250 MG PO TABS: ORAL_TABLET | ORAL | 0 refills | Status: DC

## 2016-01-30 NOTE — Progress Notes (Signed)
   Subjective:    Patient ID: Connie Colon, female    DOB: 05/21/1950, 66 y.o.   MRN: [id number]  Cough  This is a new problem. The current episode started in the past 7 days. The problem has been unchanged. The cough is non-productive. Associated symptoms include a fever, nasal congestion and a sore throat. Nothing aggravates the symptoms. She has tried prescription cough suppressant for the symptoms. The treatment provided moderate relief.   Patient has no other concerns at this time.  Patient relates some body aches not feeling good denies high fever-relates low-grade fever  Review of Systems  Constitutional: Positive for fever.  HENT: Positive for sore throat.   Respiratory: Positive for cough.        Objective:   Physical Exam Patient does not appear to be toxic. No wheezing or crackles are noted except a slight congestion in the left lower base. Patient not rest or distress does not appear in any discomfort       Assessment & Plan:  Viral syndrome Flulike illness Secondary bronchitis Possible early pneumonia X-rays lab work not indicated Start Zithromax Patient was warned that if not improving over the next 2-3 days to follow-up Patient should gradually turn the corner over this weekend

## 2016-02-04 ENCOUNTER — Telehealth: Payer: Self-pay | Admitting: Family Medicine

## 2016-02-04 NOTE — Telephone Encounter (Signed)
Discussed with pt. Pt verbalized understanding.  °

## 2016-02-04 NOTE — Telephone Encounter (Signed)
Patient seen Dr. Nicki Reaper on 01/30/16 for influenza.  She said she finished her antibiotic, but she still has a bad cough.  She says the cold tablet she is taking dries it up, but when that wears off she starts coughing again.  Please advise.  [facility]

## 2016-02-04 NOTE — Telephone Encounter (Signed)
Seen 1/4. Finished zpack. Feeling a lot better but still having cough, no fever, no sob. Coughing up clear mucus.

## 2016-02-04 NOTE — Telephone Encounter (Signed)
zpk lasts in bloodstream for ten full d , not surprised stil some residual cough at this point should slowly improve

## 2016-06-16 ENCOUNTER — Ambulatory Visit: Payer: Self-pay | Admitting: Family Medicine

## 2016-06-17 ENCOUNTER — Telehealth: Payer: Self-pay | Admitting: Family Medicine

## 2016-06-17 NOTE — Telephone Encounter (Signed)
Received Prior-Authorization Approval for Chlorzoxazone 500mg  from [employer].

## 2016-06-29 ENCOUNTER — Other Ambulatory Visit: Payer: Self-pay | Admitting: Family Medicine

## 2016-06-29 NOTE — Telephone Encounter (Signed)
Ok plus 2 ref 

## 2016-06-29 NOTE — Telephone Encounter (Signed)
Last seen 12/23/16

## 2016-07-14 ENCOUNTER — Telehealth: Payer: Self-pay | Admitting: Family Medicine

## 2016-07-14 ENCOUNTER — Other Ambulatory Visit: Payer: Self-pay

## 2016-07-14 DIAGNOSIS — R5383 Other fatigue: Secondary | ICD-10-CM

## 2016-07-14 DIAGNOSIS — Z79899 Other long term (current) drug therapy: Secondary | ICD-10-CM

## 2016-07-14 DIAGNOSIS — E785 Hyperlipidemia, unspecified: Secondary | ICD-10-CM

## 2016-07-14 NOTE — Telephone Encounter (Signed)
Last labs were drawn on Nov 20,2017, she had Lipid,hepatic,bmet,tsh drawn at that time.

## 2016-07-14 NOTE — Telephone Encounter (Signed)
Left a vm to r/c with the pt husband.

## 2016-07-14 NOTE — Telephone Encounter (Signed)
Pt is needing lab orders sent over for an appt that is scheduled for the second week of July.

## 2016-07-14 NOTE — Telephone Encounter (Signed)
Rep same 

## 2016-07-15 NOTE — Telephone Encounter (Signed)
Orders put in. Pt notified.  

## 2016-07-23 DIAGNOSIS — H43812 Vitreous degeneration, left eye: Secondary | ICD-10-CM | POA: Diagnosis not present

## 2016-07-28 DIAGNOSIS — E785 Hyperlipidemia, unspecified: Secondary | ICD-10-CM | POA: Diagnosis not present

## 2016-07-28 DIAGNOSIS — R5383 Other fatigue: Secondary | ICD-10-CM | POA: Diagnosis not present

## 2016-07-28 DIAGNOSIS — Z79899 Other long term (current) drug therapy: Secondary | ICD-10-CM | POA: Diagnosis not present

## 2016-07-29 LAB — BASIC METABOLIC PANEL
BUN/Creatinine Ratio: 19 (ref 12–28)
BUN: 16 mg/dL (ref 8–27)
CALCIUM: 9.6 mg/dL (ref 8.7–10.3)
CHLORIDE: 101 mmol/L (ref 96–106)
CO2: 26 mmol/L (ref 20–29)
Creatinine, Ser: 0.83 mg/dL (ref 0.57–1.00)
GFR calc Af Amer: 86 mL/min/{1.73_m2} (ref >59)
GFR calc non Af Amer: 74 mL/min/{1.73_m2} (ref >59)
Glucose: 89 mg/dL (ref 65–99)
POTASSIUM: 4.7 mmol/L (ref 3.5–5.2)
Sodium: 141 mmol/L (ref 134–144)

## 2016-07-29 LAB — LIPID PANEL
CHOL/HDL RATIO: 5.8 ratio — AB (ref 0.0–4.4)
Cholesterol, Total: 227 mg/dL — ABNORMAL HIGH (ref 100–199)
HDL: 39 mg/dL — ABNORMAL LOW (ref >39)
LDL CALC: 121 mg/dL — AB (ref 0–99)
Triglycerides: 337 mg/dL — ABNORMAL HIGH (ref 0–149)
VLDL Cholesterol Cal: 67 mg/dL — ABNORMAL HIGH (ref 5–40)

## 2016-07-29 LAB — HEPATIC FUNCTION PANEL
ALBUMIN: 4.5 g/dL (ref 3.6–4.8)
ALT: 23 IU/L (ref 0–32)
AST: 24 IU/L (ref 0–40)
Alkaline Phosphatase: 81 IU/L (ref 39–117)
BILIRUBIN, DIRECT: 0.12 mg/dL (ref 0.00–0.40)
Bilirubin Total: 0.6 mg/dL (ref 0.0–1.2)
TOTAL PROTEIN: 7.2 g/dL (ref 6.0–8.5)

## 2016-07-29 LAB — TSH: TSH: 4.99 u[IU]/mL — ABNORMAL HIGH (ref 0.450–4.500)

## 2016-08-04 ENCOUNTER — Encounter: Payer: Self-pay | Admitting: Family Medicine

## 2016-08-04 ENCOUNTER — Ambulatory Visit (INDEPENDENT_AMBULATORY_CARE_PROVIDER_SITE_OTHER): Payer: Medicare Other | Admitting: Family Medicine

## 2016-08-04 VITALS — BP 130/92 | Ht 61.0 in | Wt 134.0 lb

## 2016-08-04 DIAGNOSIS — K219 Gastro-esophageal reflux disease without esophagitis: Secondary | ICD-10-CM

## 2016-08-04 DIAGNOSIS — E785 Hyperlipidemia, unspecified: Secondary | ICD-10-CM

## 2016-08-04 DIAGNOSIS — Z23 Encounter for immunization: Secondary | ICD-10-CM | POA: Diagnosis not present

## 2016-08-04 DIAGNOSIS — G250 Essential tremor: Secondary | ICD-10-CM | POA: Diagnosis not present

## 2016-08-04 DIAGNOSIS — E039 Hypothyroidism, unspecified: Secondary | ICD-10-CM | POA: Diagnosis not present

## 2016-08-04 DIAGNOSIS — F411 Generalized anxiety disorder: Secondary | ICD-10-CM

## 2016-08-04 DIAGNOSIS — R5383 Other fatigue: Secondary | ICD-10-CM

## 2016-08-04 MED ORDER — ALPRAZOLAM 0.25 MG PO TABS: 0.2500 mg | ORAL_TABLET | Freq: Two times a day (BID) | ORAL | 5 refills | Status: DC

## 2016-08-04 MED ORDER — ESCITALOPRAM OXALATE 10 MG PO TABS: 10.0000 mg | ORAL_TABLET | Freq: Every day | ORAL | 1 refills | Status: DC

## 2016-08-04 NOTE — Progress Notes (Signed)
Subjective:    Patient ID: Connie Colon, female    DOB: October 20, 1950, 66 y.o.   MRN: [id number] Patient arrives office for an extremely protracted visit Hyperlipidemia  This is a recurrent problem. The current episode started more than 1 year ago. The problem is controlled.  Patient states she watches what she eats and she does exercise.  Would like for [place] to look in her ears, states she thinks she has wax build up. Right ear full and stopped up. Generally sees Dr. Susanne Borders if persistently elevated  Pt has ut down meat , working on diet  Reflux overall sig better, taking ranitidine  Pt notes chest pain, coes and goes, worse at hnight, ets anxious so "heart hurns", gets no pain in heart when exercising. Pain is sharp at times. At times worse almost always when she is worrying about things. Notes virtually no discomfort with exertion during the day.  Notes ongoing challenges with anxiety. Also depression Onset at times. Definitely worse. Feels she needs to increase her Xanax. Already on 2 per day.  Compliant with Zantac which states overall help and reflux.  Compliant with red yeast rice extract.   Results for orders placed or performed in visit on 07/14/16  Hepatic function panel  Result Value Ref Range   Total Protein 7.2 6.0 - 8.5 g/dL   Albumin 4.5 3.6 - 4.8 g/dL   Bilirubin Total 0.6 0.0 - 1.2 mg/dL   Bilirubin, Direct 0.12 0.00 - 0.40 mg/dL   Alkaline Phosphatase 81 39 - 117 IU/L   AST 24 0 - 40 IU/L   ALT 23 0 - 32 IU/L  Lipid panel  Result Value Ref Range   Cholesterol, Total 227 (H) 100 - 199 mg/dL   Triglycerides 337 (H) 0 - 149 mg/dL   HDL 39 (L) >39 mg/dL   VLDL Cholesterol Cal 67 (H) 5 - 40 mg/dL   LDL Calculated 121 (H) 0 - 99 mg/dL   Chol/HDL Ratio 5.8 (H) 0.0 - 4.4 ratio  Basic metabolic panel  Result Value Ref Range   Glucose 89 65 - 99 mg/dL   BUN 16 8 - 27 mg/dL   Creatinine, Ser 0.83 0.57 - 1.00 mg/dL   GFR calc non Af Amer 74 >59 mL/min/1.73   GFR calc  Af Amer 86 >59 mL/min/1.73   BUN/Creatinine Ratio 19 12 - 28   Sodium 141 134 - 144 mmol/L   Potassium 4.7 3.5 - 5.2 mmol/L   Chloride 101 96 - 106 mmol/L   CO2 26 20 - 29 mmol/L   Calcium 9.6 8.7 - 10.3 mg/dL  TSH  Result Value Ref Range   TSH 4.990 (H) 0.450 - 4.500 uIU/mL    Exercising most every d per wk, firve to six   Review of Systems No headache, no major weight loss or weight gain, no chest pain no back pain abdominal pain no change in bowel habits complete ROS otherwise negative     Objective:   Physical Exam  Alert and oriented, vitals reviewed and stable, NAD ENT-TM's and ext canals WNL Ears significant cerumen impaction bilat via otoscopic exam Soft palate, tonsils and post pharynx WNL via oropharyngeal exam Neck-symmetric, no masses; thyroid nonpalpable and nontender Pulmonary-no tachypnea or accessory muscle use; Clear without wheezes via auscultation Card--no abnrml murmurs, rhythm reg and rate WNL Carotid pulses symmetric, without bruits       Assessment & Plan:  prevnar due today, Impression 1 generalized anxiety disorder with element of  depression. Extremely long conversation held. I'm reluctant increasing Xanax. Encouraged her to reconsider using something like Lexapro. Medications discuss side effects benefits discussed and initiated #2 borderline lipidemia discussed #3 chest pain highly unlikely to be cardiac due to no occurrence with exertion. Discussed #4 reflux good control discussed maintain #5 substantial cerumen impaction patient get back to ENT discussed #6 borderline TSH. Need to reassess TSH in a couple months before intervening discussed at length  Greater than 50% of this 40 minute face to face visit was spent in counseling and discussion and coordination of care regarding the above diagnosis/diagnosies

## 2016-08-11 DIAGNOSIS — H43812 Vitreous degeneration, left eye: Secondary | ICD-10-CM | POA: Diagnosis not present

## 2016-08-18 DIAGNOSIS — Z1231 Encounter for screening mammogram for malignant neoplasm of breast: Secondary | ICD-10-CM | POA: Diagnosis not present

## 2016-08-20 ENCOUNTER — Telehealth: Payer: Self-pay | Admitting: Family Medicine

## 2016-08-20 NOTE — Telephone Encounter (Signed)
Review screening mammogram results in results folder. °

## 2016-09-09 ENCOUNTER — Other Ambulatory Visit: Payer: Self-pay | Admitting: Family Medicine

## 2016-09-24 ENCOUNTER — Ambulatory Visit (INDEPENDENT_AMBULATORY_CARE_PROVIDER_SITE_OTHER): Payer: Medicare Other | Admitting: Otolaryngology

## 2016-09-24 DIAGNOSIS — H6123 Impacted cerumen, bilateral: Secondary | ICD-10-CM

## 2016-10-07 DIAGNOSIS — E039 Hypothyroidism, unspecified: Secondary | ICD-10-CM | POA: Diagnosis not present

## 2016-10-08 ENCOUNTER — Encounter: Payer: Self-pay | Admitting: Nurse Practitioner

## 2016-10-08 LAB — TSH: TSH: 3.92 u[IU]/mL (ref 0.450–4.500)

## 2016-10-10 ENCOUNTER — Encounter: Payer: Self-pay | Admitting: Family Medicine

## 2016-10-15 DIAGNOSIS — D225 Melanocytic nevi of trunk: Secondary | ICD-10-CM | POA: Diagnosis not present

## 2016-10-15 DIAGNOSIS — Z1283 Encounter for screening for malignant neoplasm of skin: Secondary | ICD-10-CM | POA: Diagnosis not present

## 2016-10-19 ENCOUNTER — Encounter: Payer: Self-pay | Admitting: Nurse Practitioner

## 2016-10-19 ENCOUNTER — Ambulatory Visit (INDEPENDENT_AMBULATORY_CARE_PROVIDER_SITE_OTHER): Payer: Medicare Other | Admitting: Nurse Practitioner

## 2016-10-19 VITALS — BP 144/94 | Ht 61.0 in | Wt 135.0 lb

## 2016-10-19 DIAGNOSIS — Z780 Asymptomatic menopausal state: Secondary | ICD-10-CM | POA: Diagnosis not present

## 2016-10-19 DIAGNOSIS — Z0000 Encounter for general adult medical examination without abnormal findings: Secondary | ICD-10-CM | POA: Diagnosis not present

## 2016-10-19 DIAGNOSIS — Z1159 Encounter for screening for other viral diseases: Secondary | ICD-10-CM | POA: Diagnosis not present

## 2016-10-19 DIAGNOSIS — Z23 Encounter for immunization: Secondary | ICD-10-CM

## 2016-10-20 ENCOUNTER — Encounter: Payer: Self-pay | Admitting: Nurse Practitioner

## 2016-10-20 LAB — HEPATITIS C ANTIBODY: Hep C Virus Ab: <0.1 s/co ratio (ref 0.0–0.9)

## 2016-10-20 NOTE — Progress Notes (Signed)
   Subjective:    Patient ID: Connie Colon, female    DOB: [birth year], 66 y.o.   MRN: [id number]  HPI presents for her wellness exam. Had a partial hysterectomy for bleeding. No pelvic pain. Same sexual partner. Chronic external itching at night. Using Preparation H as recommended by GYN in [place]. This provides some relief. BP usually runs well outside of the office. Uses a small amount of Estrace inside vagina once a week. Had mammogram in [facility] July 2018 which was normal. Rides bike 30 minutes per day 5 days per week. Regular vision and dental exams.     Review of Systems  Constitutional: Negative for activity change, appetite change and fatigue.  HENT: Negative for dental problem, ear pain, sinus pressure and sore throat.   Respiratory: Negative for cough, chest tightness, shortness of breath and wheezing.   Cardiovascular: Negative for chest pain.  Gastrointestinal: Negative for abdominal distention, abdominal pain, blood in stool, constipation, diarrhea, nausea and vomiting.  Genitourinary: Negative for difficulty urinating, dysuria, enuresis, frequency, genital sores, pelvic pain, urgency and vaginal discharge.       Objective:   Physical Exam  Constitutional: She is oriented to person, place, and time. She appears well-developed. No distress.  HENT:  Right Ear: External ear normal.  Left Ear: External ear normal.  Mouth/Throat: Oropharynx is clear and moist.  Neck: Normal range of motion. Neck supple. No tracheal deviation present. No thyromegaly present.  Cardiovascular: Normal rate, regular rhythm and normal heart sounds.  Exam reveals no gallop.   No murmur heard. Pulmonary/Chest: Effort normal and breath sounds normal. Right breast exhibits no inverted nipple, no mass, no skin change and no tenderness. Left breast exhibits no inverted nipple, no mass, no skin change and no tenderness. Breasts are symmetrical.  Abdominal: Soft. She exhibits no distension. There is no  tenderness.  Genitourinary: Vagina normal. No vaginal discharge found.  Genitourinary Comments: External GU: no rashes or lesions. Skin pale and dry. Vagina: slightly pink, moist, no discharge. Bimanual exam: no tenderness or obvious masses.   Musculoskeletal: She exhibits no edema.  Lymphadenopathy:    She has no cervical adenopathy.  Neurological: She is alert and oriented to person, place, and time.  Skin: Skin is warm and dry. No rash noted.  Psychiatric: She has a normal mood and affect. Her behavior is normal.  Vitals reviewed. Axillae: no lymphadenopathy.        Assessment & Plan:  Annual physical exam  Postmenopausal - Plan: DG Bone Density  Need for influenza vaccination - Plan: Flu Vaccine QUAD 36+ mos IM  Encounter for hepatitis C screening test for low risk patient - Plan: Hepatitis C antibody  Recommend daily vitamin D and calcium. Schedule bone density.  Labs UTD.  Return in about 1 year (around 10/19/2017) for physical. Routine follow up for chronic health issues.

## 2016-10-22 ENCOUNTER — Other Ambulatory Visit (HOSPITAL_COMMUNITY): Payer: Medicare Other

## 2016-11-03 ENCOUNTER — Ambulatory Visit (HOSPITAL_COMMUNITY)
Admission: RE | Admit: 2016-11-03 | Discharge: 2016-11-03 | Disposition: A | Discharge status: Home / Self Care | Payer: Medicare Other | Source: Ambulatory Visit | Attending: Nurse Practitioner | Admitting: Nurse Practitioner

## 2016-11-03 DIAGNOSIS — Z13820 Encounter for screening for osteoporosis: Secondary | ICD-10-CM | POA: Insufficient documentation

## 2016-11-03 DIAGNOSIS — M8580 Other specified disorders of bone density and structure, unspecified site: Secondary | ICD-10-CM | POA: Diagnosis not present

## 2016-11-03 DIAGNOSIS — Z780 Asymptomatic menopausal state: Secondary | ICD-10-CM | POA: Insufficient documentation

## 2016-11-03 DIAGNOSIS — M8589 Other specified disorders of bone density and structure, multiple sites: Secondary | ICD-10-CM | POA: Diagnosis not present

## 2016-11-24 DIAGNOSIS — H40003 Preglaucoma, unspecified, bilateral: Secondary | ICD-10-CM | POA: Diagnosis not present

## 2016-11-24 DIAGNOSIS — H40013 Open angle with borderline findings, low risk, bilateral: Secondary | ICD-10-CM | POA: Diagnosis not present

## 2016-11-24 DIAGNOSIS — H18893 Other specified disorders of cornea, bilateral: Secondary | ICD-10-CM | POA: Diagnosis not present

## 2017-01-10 ENCOUNTER — Other Ambulatory Visit: Payer: Self-pay | Admitting: Family Medicine

## 2017-01-21 ENCOUNTER — Telehealth: Payer: Self-pay | Admitting: Family Medicine

## 2017-01-21 DIAGNOSIS — Z131 Encounter for screening for diabetes mellitus: Secondary | ICD-10-CM

## 2017-01-21 DIAGNOSIS — R5383 Other fatigue: Secondary | ICD-10-CM

## 2017-01-21 DIAGNOSIS — Z13220 Encounter for screening for lipoid disorders: Secondary | ICD-10-CM

## 2017-01-21 NOTE — Telephone Encounter (Signed)
Patient has a 6 mos f/u appt with Dr. Richardson Landry on Jan. 8th.  Does she need to have labs done before her appt? If so, she will need an order put in for [facility].

## 2017-01-21 NOTE — Telephone Encounter (Signed)
Patient had Lipid, liver, met 7, hep c, and Tsh 07/2016

## 2017-01-21 NOTE — Telephone Encounter (Signed)
Orders put in. Pt notified.  

## 2017-01-21 NOTE — Telephone Encounter (Signed)
Lip liv glu tsh

## 2017-01-27 DIAGNOSIS — Z13220 Encounter for screening for lipoid disorders: Secondary | ICD-10-CM | POA: Diagnosis not present

## 2017-01-27 DIAGNOSIS — R5383 Other fatigue: Secondary | ICD-10-CM | POA: Diagnosis not present

## 2017-01-27 DIAGNOSIS — Z131 Encounter for screening for diabetes mellitus: Secondary | ICD-10-CM | POA: Diagnosis not present

## 2017-01-27 DIAGNOSIS — E785 Hyperlipidemia, unspecified: Secondary | ICD-10-CM | POA: Diagnosis not present

## 2017-01-28 LAB — LIPID PANEL
Chol/HDL Ratio: 6 ratio — ABNORMAL HIGH (ref 0.0–4.4)
Cholesterol, Total: 228 mg/dL — ABNORMAL HIGH (ref 100–199)
HDL: 38 mg/dL — AB (ref >39)
LDL CALC: 137 mg/dL — AB (ref 0–99)
TRIGLYCERIDES: 266 mg/dL — AB (ref 0–149)
VLDL CHOLESTEROL CAL: 53 mg/dL — AB (ref 5–40)

## 2017-01-28 LAB — HEPATIC FUNCTION PANEL
ALT: 24 IU/L (ref 0–32)
AST: 22 IU/L (ref 0–40)
Albumin: 4.2 g/dL (ref 3.6–4.8)
Alkaline Phosphatase: 75 IU/L (ref 39–117)
BILIRUBIN, DIRECT: 0.13 mg/dL (ref 0.00–0.40)
Bilirubin Total: 0.5 mg/dL (ref 0.0–1.2)
Total Protein: 7.1 g/dL (ref 6.0–8.5)

## 2017-01-28 LAB — GLUCOSE, RANDOM: GLUCOSE: 95 mg/dL (ref 65–99)

## 2017-01-28 LAB — TSH: TSH: 4.13 u[IU]/mL (ref 0.450–4.500)

## 2017-02-02 ENCOUNTER — Encounter: Payer: Self-pay | Admitting: Family Medicine

## 2017-02-02 ENCOUNTER — Ambulatory Visit (INDEPENDENT_AMBULATORY_CARE_PROVIDER_SITE_OTHER): Payer: Medicare Other | Admitting: Family Medicine

## 2017-02-02 VITALS — BP 142/90 | Ht 61.0 in | Wt 139.6 lb

## 2017-02-02 DIAGNOSIS — E785 Hyperlipidemia, unspecified: Secondary | ICD-10-CM

## 2017-02-02 DIAGNOSIS — E039 Hypothyroidism, unspecified: Secondary | ICD-10-CM

## 2017-02-02 DIAGNOSIS — G250 Essential tremor: Secondary | ICD-10-CM

## 2017-02-02 DIAGNOSIS — I10 Essential (primary) hypertension: Secondary | ICD-10-CM | POA: Diagnosis not present

## 2017-02-02 MED ORDER — ALPRAZOLAM 0.25 MG PO TABS: 0.1250 mg | ORAL_TABLET | Freq: Two times a day (BID) | ORAL | 5 refills | Status: DC

## 2017-02-02 MED ORDER — ATORVASTATIN CALCIUM 20 MG PO TABS: 20.0000 mg | ORAL_TABLET | Freq: Every day | ORAL | 5 refills | Status: DC

## 2017-02-02 NOTE — Patient Instructions (Signed)
High Cholesterol High cholesterol is a condition in which the blood has high levels of a white, waxy, fat-like substance (cholesterol). The human body needs small amounts of cholesterol. The liver makes all the cholesterol that the body needs. Extra (excess) cholesterol comes from the food that we eat. Cholesterol is carried from the liver by the blood through the blood vessels. If you have high cholesterol, deposits (plaques) may build up on the walls of your blood vessels (arteries). Plaques make the arteries narrower and stiffer. Cholesterol plaques increase your risk for heart attack and stroke. Work with your health care provider to keep your cholesterol levels in a healthy range. What increases the risk? This condition is more likely to develop in people who:  Eat foods that are high in animal fat (saturated fat) or cholesterol.  Are overweight.  Are not getting enough exercise.  Have a family history of high cholesterol.  What are the signs or symptoms? There are no symptoms of this condition. How is this diagnosed? This condition may be diagnosed from the results of a blood test.  If you are older than age 20, your health care provider may check your cholesterol every 4-6 years.  You may be checked more often if you already have high cholesterol or other risk factors for heart disease.  The blood test for cholesterol measures:  "Bad" cholesterol (LDL cholesterol). This is the main type of cholesterol that causes heart disease. The desired level for LDL is less than 100.  "Good" cholesterol (HDL cholesterol). This type helps to protect against heart disease by cleaning the arteries and carrying the LDL away. The desired level for HDL is 60 or higher.  Triglycerides. These are fats that the body can store or burn for energy. The desired number for triglycerides is lower than 150.  Total cholesterol. This is a measure of the total amount of cholesterol in your blood, including LDL  cholesterol, HDL cholesterol, and triglycerides. A healthy number is less than 200.  How is this treated? This condition is treated with diet changes, lifestyle changes, and medicines. Diet changes  This may include eating more whole grains, fruits, vegetables, nuts, and fish.  This may also include cutting back on red meat and foods that have a lot of added sugar. Lifestyle changes  Changes may include getting at least 40 minutes of aerobic exercise 3 times a week. Aerobic exercises include walking, biking, and swimming. Aerobic exercise along with a healthy diet can help you maintain a healthy weight.  Changes may also include quitting smoking. Medicines  Medicines are usually given if diet and lifestyle changes have failed to reduce your cholesterol to healthy levels.  Your health care provider may prescribe a statin medicine. Statin medicines have been shown to reduce cholesterol, which can reduce the risk of heart disease. Follow these instructions at home: Eating and drinking  If told by your health care provider:  Eat chicken (without skin), fish, veal, shellfish, ground turkey breast, and round or loin cuts of red meat.  Do not eat fried foods or fatty meats, such as hot dogs and salami.  Eat plenty of fruits, such as apples.  Eat plenty of vegetables, such as broccoli, potatoes, and carrots.  Eat beans, peas, and lentils.  Eat grains such as barley, rice, couscous, and bulgur wheat.  Eat pasta without cream sauces.  Use skim or nonfat milk, and eat low-fat or nonfat yogurt and cheeses.  Do not eat or drink whole milk, cream, ice   cream, egg yolks, or hard cheeses.  Do not eat stick margarine or tub margarines that contain trans fats (also called partially hydrogenated oils).  Do not eat saturated tropical oils, such as coconut oil and palm oil.  Do not eat cakes, cookies, crackers, or other baked goods that contain trans fats.  General instructions  Exercise  as directed by your health care provider. Increase your activity level with activities such as gardening, walking, and taking the stairs.  Take over-the-counter and prescription medicines only as told by your health care provider.  Do not use any products that contain nicotine or tobacco, such as cigarettes and e-cigarettes. If you need help quitting, ask your health care provider.  Keep all follow-up visits as told by your health care provider. This is important. Contact a health care provider if:  You are struggling to maintain a healthy diet or weight.  You need help to start on an exercise program.  You need help to stop smoking. Get help right away if:  You have chest pain.  You have trouble breathing. This information is not intended to replace advice given to you by your health care provider. Make sure you discuss any questions you have with your health care provider. Document Released: 01/12/2005 Document Revised: 08/10/2015 Document Reviewed: 07/13/2015 Elsevier Interactive Patient Education  2018 [employer]. Hypertension Hypertension, commonly called high blood pressure, is when the force of blood pumping through the arteries is too strong. The arteries are the blood vessels that carry blood from the heart throughout the body. Hypertension forces the heart to work harder to pump blood and may cause arteries to become narrow or stiff. Having untreated or uncontrolled hypertension can cause heart attacks, strokes, kidney disease, and other problems. A blood pressure reading consists of a higher number over a lower number. Ideally, your blood pressure should be below 120/80. The first ("top") number is called the systolic pressure. It is a measure of the pressure in your arteries as your heart beats. The second ("bottom") number is called the diastolic pressure. It is a measure of the pressure in your arteries as the heart relaxes. What are the causes? The cause of this condition  is not known. What increases the risk? Some risk factors for high blood pressure are under your control. Others are not. Factors you can change  Smoking.  Having type 2 diabetes mellitus, high cholesterol, or both.  Not getting enough exercise or physical activity.  Being overweight.  Having too much fat, sugar, calories, or salt (sodium) in your diet.  Drinking too much alcohol. Factors that are difficult or impossible to change  Having chronic kidney disease.  Having a family history of high blood pressure.  Age. Risk increases with age.  Race. You may be at higher risk if you are African-American.  Gender. Men are at higher risk than women before age 68. After age 7, women are at higher risk than men.  Having obstructive sleep apnea.  Stress. What are the signs or symptoms? Extremely high blood pressure (hypertensive crisis) may cause:  Headache.  Anxiety.  Shortness of breath.  Nosebleed.  Nausea and vomiting.  Severe chest pain.  Jerky movements you cannot control (seizures).  How is this diagnosed? This condition is diagnosed by measuring your blood pressure while you are seated, with your arm resting on a surface. The cuff of the blood pressure monitor will be placed directly against the skin of your upper arm at the level of your heart. It  should be measured at least twice using the same arm. Certain conditions can cause a difference in blood pressure between your right and left arms. Certain factors can cause blood pressure readings to be lower or higher than normal (elevated) for a short period of time:  When your blood pressure is higher when you are in a health care provider's office than when you are at home, this is called white coat hypertension. Most people with this condition do not need medicines.  When your blood pressure is higher at home than when you are in a health care provider's office, this is called masked hypertension. Most people  with this condition may need medicines to control blood pressure.  If you have a high blood pressure reading during one visit or you have normal blood pressure with other risk factors:  You may be asked to return on a different day to have your blood pressure checked again.  You may be asked to monitor your blood pressure at home for 1 week or longer.  If you are diagnosed with hypertension, you may have other blood or imaging tests to help your health care provider understand your overall risk for other conditions. How is this treated? This condition is treated by making healthy lifestyle changes, such as eating healthy foods, exercising more, and reducing your alcohol intake. Your health care provider may prescribe medicine if lifestyle changes are not enough to get your blood pressure under control, and if:  Your systolic blood pressure is above 130.  Your diastolic blood pressure is above 80.  Your personal target blood pressure may vary depending on your medical conditions, your age, and other factors. Follow these instructions at home: Eating and drinking  Eat a diet that is high in fiber and potassium, and low in sodium, added sugar, and fat. An example eating plan is called the DASH (Dietary Approaches to Stop Hypertension) diet. To eat this way: ? Eat plenty of fresh fruits and vegetables. Try to fill half of your plate at each meal with fruits and vegetables. ? Eat whole grains, such as whole wheat pasta, brown rice, or whole grain bread. Fill about one quarter of your plate with whole grains. ? Eat or drink low-fat dairy products, such as skim milk or low-fat yogurt. ? Avoid fatty cuts of meat, processed or cured meats, and poultry with skin. Fill about one quarter of your plate with lean proteins, such as fish, chicken without skin, beans, eggs, and tofu. ? Avoid premade and processed foods. These tend to be higher in sodium, added sugar, and fat.  Reduce your daily sodium  intake. Most people with hypertension should eat less than 1,500 mg of sodium a day.  Limit alcohol intake to no more than 1 drink a day for nonpregnant women and 2 drinks a day for men. One drink equals 12 oz of beer, 5 oz of wine, or 1 oz of hard liquor. Lifestyle  Work with your health care provider to maintain a healthy body weight or to lose weight. Ask what an ideal weight is for you.  Get at least 30 minutes of exercise that causes your heart to beat faster (aerobic exercise) most days of the week. Activities may include walking, swimming, or biking.  Include exercise to strengthen your muscles (resistance exercise), such as pilates or lifting weights, as part of your weekly exercise routine. Try to do these types of exercises for 30 minutes at least 3 days a week.  Do not use  any products that contain nicotine or tobacco, such as cigarettes and e-cigarettes. If you need help quitting, ask your health care provider.  Monitor your blood pressure at home as told by your health care provider.  Keep all follow-up visits as told by your health care provider. This is important. Medicines  Take over-the-counter and prescription medicines only as told by your health care provider. Follow directions carefully. Blood pressure medicines must be taken as prescribed.  Do not skip doses of blood pressure medicine. Doing this puts you at risk for problems and can make the medicine less effective.  Ask your health care provider about side effects or reactions to medicines that you should watch for. Contact a health care provider if:  You think you are having a reaction to a medicine you are taking.  You have headaches that keep coming back (recurring).  You feel dizzy.  You have swelling in your ankles.  You have trouble with your vision. Get help right away if:  You develop a severe headache or confusion.  You have unusual weakness or numbness.  You feel faint.  You have severe  pain in your chest or abdomen.  You vomit repeatedly.  You have trouble breathing. Summary  Hypertension is when the force of blood pumping through your arteries is too strong. If this condition is not controlled, it may put you at risk for serious complications.  Your personal target blood pressure may vary depending on your medical conditions, your age, and other factors. For most people, a normal blood pressure is less than 120/80.  Hypertension is treated with lifestyle changes, medicines, or a combination of both. Lifestyle changes include weight loss, eating a healthy, low-sodium diet, exercising more, and limiting alcohol. This information is not intended to replace advice given to you by your health care provider. Make sure you discuss any questions you have with your health care provider. Document Released: 01/12/2005 Document Revised: 12/11/2015 Document Reviewed: 12/11/2015 Elsevier Interactive Patient Education  [employer].

## 2017-02-02 NOTE — Progress Notes (Signed)
   Subjective:    Patient ID: Connie Colon, female    DOB: 1950/05/02, 67 y.o.   MRN: [id number]  Hyperlipidemia  This is a chronic problem. The current episode started more than 1 year ago. Treatments tried: red yeast rice. There are no compliance problems.  Risk factors for coronary artery disease include dyslipidemia and post-menopausal.   Patient would like to discuss her vitamins to see how much is the right amount of calcium  Results for orders placed or performed in visit on 01/21/17  Lipid panel  Result Value Ref Range   Cholesterol, Total 228 (H) 100 - 199 mg/dL   Triglycerides 266 (H) 0 - 149 mg/dL   HDL 38 (L) >39 mg/dL   VLDL Cholesterol Cal 53 (H) 5 - 40 mg/dL   LDL Calculated 137 (H) 0 - 99 mg/dL   Chol/HDL Ratio 6.0 (H) 0.0 - 4.4 ratio  Hepatic function panel  Result Value Ref Range   Total Protein 7.1 6.0 - 8.5 g/dL   Albumin 4.2 3.6 - 4.8 g/dL   Bilirubin Total 0.5 0.0 - 1.2 mg/dL   Bilirubin, Direct 0.13 0.00 - 0.40 mg/dL   Alkaline Phosphatase 75 39 - 117 IU/L   AST 22 0 - 40 IU/L   ALT 24 0 - 32 IU/L  Glucose, random  Result Value Ref Range   Glucose 95 65 - 99 mg/dL  TSH  Result Value Ref Range   TSH 4.130 0.450 - 4.500 uIU/mL   Hx of elevated blood pressure  Mo had several strokes and heart atack  Patient trying to watch salt intake.  Anxiety ongoing challenge.  Patient claims compliance with medicines no obvious side effects  Chol med caused flu like symtoms in the past   Patient continues to take lipid medication regularly. No obvious side effects from it. Generally does not miss a dose. Prior blood work results are reviewed with patient. Patient continues to work on fat intake in diet  Pt uses red yeat rice extract taking two red yeat rice tabs  Five times per wk , thirty min  Six miles Review of Systems No headache, no major weight loss or weight gain, no chest pain no back pain abdominal pain no change in bowel habits complete ROS  otherwise negative     Objective:   Physical Exam  Alert and oriented, vitals reviewed and stable, NAD ENT-TM's and ext canals WNL bilat via otoscopic exam Soft palate, tonsils and post pharynx WNL via oropharyngeal exam Neck-symmetric, no masses; thyroid nonpalpable and nontender Pulmonary-no tachypnea or accessory muscle use; Clear without wheezes via auscultation Card--no abnrml murmurs, rhythm reg and rate WNL Carotid pulses symmetric, without bruits       Assessment & Plan:  Impression 1 chronic anxiety discussed medications refilled proper use discussed  Hypertension.  Not yet enough for initiating medicine but if continues will have to start medicine long discussion held  3.  Hyperlipidemia control insufficient with red yeast rice patient willing to try statin will prescribe Lipitor.exercise discussed hold red yeast  Follow-up as scheduled diet exercise discussed blood work prior to visit  Greater than 50% of this 25 minute face to face visit was spent in counseling and discussion and coordination of care regarding the above diagnosis/diagnosies

## 2017-03-12 ENCOUNTER — Other Ambulatory Visit: Payer: Self-pay | Admitting: Family Medicine

## 2017-04-09 ENCOUNTER — Other Ambulatory Visit: Payer: Self-pay

## 2017-04-09 ENCOUNTER — Telehealth: Payer: Self-pay | Admitting: Family Medicine

## 2017-04-09 MED ORDER — ESCITALOPRAM OXALATE 10 MG PO TABS: 10.0000 mg | ORAL_TABLET | Freq: Every day | ORAL | 1 refills | Status: DC

## 2017-04-09 NOTE — Telephone Encounter (Signed)
Pharmacy requesting Escitalopram 10mg  tab; take one tab po every day; last seen 02/02/2017

## 2017-04-09 NOTE — Telephone Encounter (Signed)
Ok six mo 

## 2017-05-03 DIAGNOSIS — I10 Essential (primary) hypertension: Secondary | ICD-10-CM | POA: Diagnosis not present

## 2017-05-03 DIAGNOSIS — E785 Hyperlipidemia, unspecified: Secondary | ICD-10-CM | POA: Diagnosis not present

## 2017-05-04 LAB — LIPID PANEL
Chol/HDL Ratio: 4.7 ratio — ABNORMAL HIGH (ref 0.0–4.4)
Cholesterol, Total: 170 mg/dL (ref 100–199)
HDL: 36 mg/dL — AB (ref >39)
LDL Calculated: 80 mg/dL (ref 0–99)
TRIGLYCERIDES: 269 mg/dL — AB (ref 0–149)
VLDL Cholesterol Cal: 54 mg/dL — ABNORMAL HIGH (ref 5–40)

## 2017-05-04 LAB — HEPATIC FUNCTION PANEL
ALK PHOS: 75 IU/L (ref 39–117)
ALT: 20 IU/L (ref 0–32)
AST: 22 IU/L (ref 0–40)
Albumin: 4.3 g/dL (ref 3.6–4.8)
BILIRUBIN TOTAL: 0.5 mg/dL (ref 0.0–1.2)
BILIRUBIN, DIRECT: 0.12 mg/dL (ref 0.00–0.40)
Total Protein: 6.9 g/dL (ref 6.0–8.5)

## 2017-05-05 ENCOUNTER — Ambulatory Visit: Payer: Medicare Other | Admitting: Family Medicine

## 2017-05-10 ENCOUNTER — Encounter: Payer: Self-pay | Admitting: Family Medicine

## 2017-05-10 ENCOUNTER — Ambulatory Visit (INDEPENDENT_AMBULATORY_CARE_PROVIDER_SITE_OTHER): Payer: Medicare Other | Admitting: Family Medicine

## 2017-05-10 VITALS — BP 136/82 | Ht 61.0 in | Wt 137.0 lb

## 2017-05-10 DIAGNOSIS — F411 Generalized anxiety disorder: Secondary | ICD-10-CM | POA: Diagnosis not present

## 2017-05-10 DIAGNOSIS — E785 Hyperlipidemia, unspecified: Secondary | ICD-10-CM

## 2017-05-10 DIAGNOSIS — G250 Essential tremor: Secondary | ICD-10-CM | POA: Diagnosis not present

## 2017-05-10 DIAGNOSIS — I10 Essential (primary) hypertension: Secondary | ICD-10-CM

## 2017-05-10 MED ORDER — ALPRAZOLAM 0.25 MG PO TABS: ORAL_TABLET | ORAL | 5 refills | Status: DC

## 2017-05-10 MED ORDER — CHLORZOXAZONE 500 MG PO TABS: ORAL_TABLET | ORAL | 5 refills | Status: DC

## 2017-05-10 MED ORDER — ATORVASTATIN CALCIUM 20 MG PO TABS: 20.0000 mg | ORAL_TABLET | Freq: Every day | ORAL | 5 refills | Status: DC

## 2017-05-10 NOTE — Progress Notes (Signed)
   Subjective:    Patient ID: Connie Colon, female    DOB: [birth year], 67 y.o.   MRN: [id number]  HPI  Patient is here today to follow up on Htn and anxiety. Takes xanax0.25 1/2 tablet in afternoon and lexapro 10 mg 1/2 bid. States she is doing much better.  Patient continues to take lipid medication regularly. No obvious side effects from it. Generally does not miss a dose. Prior blood work results are reviewed with patient. Patient continues to work on fat intake in diet  Results for orders placed or performed in visit on 02/02/17  Lipid panel  Result Value Ref Range   Cholesterol, Total 170 100 - 199 mg/dL   Triglycerides 269 (H) 0 - 149 mg/dL   HDL 36 (L) >39 mg/dL   VLDL Cholesterol Cal 54 (H) 5 - 40 mg/dL   LDL Calculated 80 0 - 99 mg/dL   Chol/HDL Ratio 4.7 (H) 0.0 - 4.4 ratio  Hepatic function panel  Result Value Ref Range   Total Protein 6.9 6.0 - 8.5 g/dL   Albumin 4.3 3.6 - 4.8 g/dL   Bilirubin Total 0.5 0.0 - 1.2 mg/dL   Bilirubin, Direct 0.12 0.00 - 0.40 mg/dL   Alkaline Phosphatase 75 39 - 117 IU/L   AST 22 0 - 40 IU/L   ALT 20 0 - 32 IU/L     Walking some and riding bike three to four times per wk for twenty minuts  geernalized anxiety, on lexapro and xanax  Reflux still acting up some, gets it pretty easily with foods, trying to cut off eve meds      BP elevated last visit.  Has worked on cutting down salt intake is okay not to add blood pressure medication     Review of Systems No headache, no major weight loss or weight gain, no chest pain no back pain abdominal pain no change in bowel habits complete ROS otherwise negative     Objective:   Physical Exam Alert and oriented, vitals reviewed and stable, NAD ENT-TM's and ext canals WNL bilat via otoscopic exam Soft palate, tonsils and post pharynx WNL via oropharyngeal exam Neck-symmetric, no masses; thyroid nonpalpable and nontender Pulmonary-no tachypnea or accessory muscle use; Clear  without wheezes via auscultation Card--no abnrml murmurs, rhythm reg and rate WNL Carotid pulses symmetric, without bruits        Assessment & Plan:  #1 hyperlipidemia.  Control much improved.  Discussed.  To maintain same meds compliance discussed  2.  Elevated blood pressure.  Somewhat improved.  No need for meds at this time  3.  Chronic anxiety ongoing for meds meds refilled  4.  Reflux.  Intermittent.  No history of dysphagia though sometimes chokes on foods.  No obstructive symptoms to continue same therapy  5.  Literally at the end of the visit patient headaches follow-up appointment scheduled to discuss his  Greater than 50% of this 25 minute face to face visit was spent in counseling and discussion and coordination of care regarding the above diagnosis/diagnosies

## 2017-05-11 ENCOUNTER — Telehealth: Payer: Self-pay | Admitting: *Deleted

## 2017-05-11 NOTE — Telephone Encounter (Signed)
Notification from pharmacy-Per insurance muscle relaxers not covered in this age group

## 2017-05-11 NOTE — Telephone Encounter (Signed)
Is this pt calling or pharm,? Does not cover anything? Like zanaflex?

## 2017-05-11 NOTE — Telephone Encounter (Signed)
Chlorzoxazone 500mg  is not covered by patient's insurance-Insurance does not cover muscle relaxers in this age group per insurance. Please advise

## 2017-05-12 NOTE — Telephone Encounter (Signed)
Let pt know her insur is not covering her muscle relaxers she can purchase them on her own if whe would like to

## 2017-05-13 NOTE — Telephone Encounter (Signed)
Pt notified and she will pay out of pocket.

## 2017-05-13 NOTE — Telephone Encounter (Signed)
Left message to return call 

## 2017-06-09 ENCOUNTER — Ambulatory Visit: Payer: Medicare Other | Admitting: Family Medicine

## 2017-06-22 ENCOUNTER — Ambulatory Visit (INDEPENDENT_AMBULATORY_CARE_PROVIDER_SITE_OTHER): Payer: Medicare Other | Admitting: Family Medicine

## 2017-06-22 ENCOUNTER — Encounter: Payer: Self-pay | Admitting: Family Medicine

## 2017-06-22 VITALS — BP 138/84 | Ht 61.0 in | Wt 139.0 lb

## 2017-06-22 DIAGNOSIS — G44219 Episodic tension-type headache, not intractable: Secondary | ICD-10-CM | POA: Diagnosis not present

## 2017-06-22 NOTE — Progress Notes (Signed)
   Subjective:    Patient ID: Connie Colon, female    DOB: January 15, 1951, 67 y.o.   MRN: [id number]  Headache   This is a recurrent problem. The current episode started more than 1 month ago.  Pt stopped taking Calcium and didn't have any headaches.   One yogurt per day    Considering ading almond milk   Overall doing much better with the headaches.  Patient only occasionally having them now.  Feels that stopping calcium definitely helped.  Wonders if she should resume vitamin D    Review of Systems  Neurological: Positive for headaches.       Objective:   Physical Exam  Alert vitals stable, NAD. Blood pressure good on repeat. HEENT normal. Lungs clear. Heart regular rate and rhythm.       Assessment & Plan:  Impression headaches/nonspecific in nature/patient states much improved encouraged to resume vitamin D.  1000 million units rationale discussed  Greater than 50% of this 15 minute face to face visit was spent in counseling and discussion and coordination of care regarding the above diagnosis/diagnosies

## 2017-09-09 ENCOUNTER — Encounter: Payer: Self-pay | Admitting: Family Medicine

## 2017-09-09 DIAGNOSIS — Z1231 Encounter for screening mammogram for malignant neoplasm of breast: Secondary | ICD-10-CM | POA: Diagnosis not present

## 2017-10-08 ENCOUNTER — Other Ambulatory Visit: Payer: Self-pay | Admitting: Family Medicine

## 2017-11-15 ENCOUNTER — Other Ambulatory Visit: Payer: Self-pay | Admitting: Family Medicine

## 2017-11-16 ENCOUNTER — Telehealth: Payer: Self-pay | Admitting: Family Medicine

## 2017-11-16 ENCOUNTER — Other Ambulatory Visit: Payer: Self-pay

## 2017-11-16 MED ORDER — ALPRAZOLAM 0.25 MG PO TABS: ORAL_TABLET | ORAL | 5 refills | Status: DC

## 2017-11-16 NOTE — Telephone Encounter (Signed)
Sure six mo worth 

## 2017-11-16 NOTE — Telephone Encounter (Signed)
Script signed and faxed to pharmacy

## 2017-11-16 NOTE — Telephone Encounter (Signed)
Pharmacy requesting refill on Alprazolam 0.5 mg tablet. Take one tablet by mouth twice a day. Please advise. Thank you

## 2017-11-16 NOTE — Telephone Encounter (Signed)
Script printed awaiting signature.  

## 2017-11-29 DIAGNOSIS — Z23 Encounter for immunization: Secondary | ICD-10-CM | POA: Diagnosis not present

## 2017-11-30 ENCOUNTER — Ambulatory Visit: Payer: Medicare Other

## 2017-12-06 DIAGNOSIS — Z6826 Body mass index (BMI) 26.0-26.9, adult: Secondary | ICD-10-CM | POA: Diagnosis not present

## 2017-12-06 DIAGNOSIS — S8991XA Unspecified injury of right lower leg, initial encounter: Secondary | ICD-10-CM | POA: Diagnosis not present

## 2017-12-06 DIAGNOSIS — M25561 Pain in right knee: Secondary | ICD-10-CM | POA: Diagnosis not present

## 2017-12-06 DIAGNOSIS — M1711 Unilateral primary osteoarthritis, right knee: Secondary | ICD-10-CM | POA: Diagnosis not present

## 2017-12-07 ENCOUNTER — Ambulatory Visit (INDEPENDENT_AMBULATORY_CARE_PROVIDER_SITE_OTHER): Payer: Medicare Other | Admitting: Orthopaedic Surgery

## 2017-12-07 ENCOUNTER — Encounter: Payer: Self-pay | Admitting: Orthopaedic Surgery

## 2017-12-07 VITALS — BP 133/81 | HR 82 | Ht 61.0 in

## 2017-12-07 DIAGNOSIS — M25561 Pain in right knee: Secondary | ICD-10-CM

## 2017-12-07 MED ORDER — HYDROCODONE-ACETAMINOPHEN 5-325 MG PO TABS: ORAL_TABLET | ORAL | 0 refills | Status: DC

## 2017-12-07 NOTE — Progress Notes (Signed)
Subjective:    Patient ID: Connie Colon, female    DOB: October 08, 1950, 67 y.o.   MRN: [id number]  HPI She began having right knee pain about three weeks ago and it has gotten worse.  She has no trauma.  She has no redness.  Yesterday at the [facility] in [place] she felt a pop in her right knee, it gave way and had swelling.  She could not bear any weight on it.  She went to the Urgent Care there.  X-rays were done and were negative.  She has crutches.  She cannot bear weight today.  She has medial joint line pain and medial pain.  She has no redness.    I have reviewed the X-rays and notes from [place].  She has pain of the knee today and inability to bear weight.  She is using her crutches.  She had a restless night.    I will get a MRI of the right knee.  I am concerned about a medial meniscus tear.  She is to continue the crutches.  She is in the process of moving.  I told her to keep her activities limited.   Review of Systems  Constitutional: Positive for activity change.  Musculoskeletal: Positive for arthralgias, gait problem and joint swelling.  All other systems reviewed and are negative.  For Review of Systems, all other systems reviewed and are negative.  The following is a summary of the past history medically, past history surgically, known current medicines, social history and family history.  This information is gathered electronically by the computer from prior information and documentation.  I review this each visit and have found including this information at this point in the chart is beneficial and informative.   Past Medical History:  Diagnosis Date  . Anxiety   . Carpal tunnel syndrome of left wrist   . Hyperlipemia   . Seasonal allergies   . Tinnitus     Past Surgical History:  Procedure Laterality Date  . ABDOMINAL HYSTERECTOMY    . CESAREAN SECTION    . COLONOSCOPY N/A 11/13/2015   Procedure: COLONOSCOPY;  Surgeon: Rogene Houston, MD;  [facility]  [facility];  Service: Endoscopy;  Laterality: N/A;  730  . HAND SURGERY  08/2010   benign tumor removed Left thumb  . TONSILLECTOMY      Current Outpatient Medications on File Prior to Visit  Medication Sig Dispense Refill  . ALPRAZolam (XANAX) 0.25 MG tablet TAKE 1 TABLET BY MOUTH TWICE DAILY ( Per Pt she takes 1/2 in the afternoon. 60 tablet 5  . atorvastatin (LIPITOR) 20 MG tablet TAKE 1 TABLET BY MOUTH EVERYDAY AT BEDTIME 90 tablet 0  . Biotin 300 MCG TABS Take by mouth. 600 mg daily.    . calcium-vitamin D (OSCAL WITH D) 500-200 MG-UNIT tablet Take 1 tablet by mouth. Per pt she takes 800 Qd.    . chlorzoxazone (PARAFON) 500 MG tablet TAKE 1/2 TABLET BY MOUTH UP TO 4 TIMES PER DAY 60 tablet 5  . cholecalciferol (VITAMIN D) 1000 UNITS tablet Take 1,000 Units by mouth daily.    Marland Kitchen escitalopram (LEXAPRO) 10 MG tablet TAKE 1 TABLET BY MOUTH EVERY DAY 90 tablet 1  . estradiol (ESTRACE) 0.1 MG/GM vaginal cream Place 1 Applicatorful vaginally once a week. Per pt she takes twice a week.    . hydrocortisone 1 % lotion Apply BID to affected area. (Patient taking differently: Apply 1 application topically 2 (two) times  daily as needed for itching. ) 118 mL 2  . metroNIDAZOLE (METROCREAM) 0.75 % cream APPLY TOPICALLY 2 TIMES DAILY TO FACE 45 g 5  . Multiple Vitamin (MULITIVITAMIN WITH MINERALS) TABS Take 1 tablet by mouth daily. Women's One-A-Day    . Multiple Vitamins-Minerals (ICAPS) CAPS Take 1 capsule by mouth daily. Take on every day     . omega-3 acid ethyl esters (LOVAZA) 1 G capsule Take 1 g by mouth 3 (three) times daily.     . ranitidine (ZANTAC) 150 MG tablet Take 150 mg by mouth 2 (two) times daily.      No current facility-administered medications on file prior to visit.     Social History   Socioeconomic History  . Marital status: Single    Spouse name: Not on file  . Number of children: Not on file  . Years of education: Not on file  . Highest education level: Not on file    Occupational History  . Not on file  Social Needs  . Financial resource strain: Not on file  . Food insecurity:    Worry: Not on file    Inability: Not on file  . Transportation needs:    Medical: Not on file    Non-medical: Not on file  Tobacco Use  . Smoking status: Never Smoker  . Smokeless tobacco: Never Used  Substance and Sexual Activity  . Alcohol use: No  . Drug use: No  . Sexual activity: Not on file  Lifestyle  . Physical activity:    Days per week: Not on file    Minutes per session: Not on file  . Stress: Not on file  Relationships  . Social connections:    Talks on phone: Not on file    Gets together: Not on file    Attends religious service: Not on file    Active member of club or organization: Not on file    Attends meetings of clubs or organizations: Not on file    Relationship status: Not on file  . Intimate partner violence:    Fear of current or ex partner: Not on file    Emotionally abused: Not on file    Physically abused: Not on file    Forced sexual activity: Not on file  Other Topics Concern  . Not on file  Social History Narrative  . Not on file    Family History  Problem Relation Age of Onset  . Hypertension Mother   . Heart attack Mother   . Hyperlipidemia Mother     BP 133/81   Pulse 82   Ht 5\' 1"  (1.549 m)   BMI 26.26 kg/m   Body mass index is 26.26 kg/m.      Objective:   Physical Exam  Constitutional: She is oriented to person, place, and time. She appears well-developed and well-nourished.  HENT:  Head: Normocephalic and atraumatic.  Eyes: Pupils are equal, round, and reactive to light. Conjunctivae and EOM are normal.  Neck: Normal range of motion. Neck supple.  Cardiovascular: Normal rate, regular rhythm and intact distal pulses.  Pulmonary/Chest: Effort normal.  Abdominal: Soft.  Musculoskeletal:       Right knee: She exhibits decreased range of motion and swelling. Tenderness found. Medial joint line  tenderness noted.       Legs: Neurological: She is alert and oriented to person, place, and time. She has normal reflexes. She displays normal reflexes. No cranial nerve deficit. She exhibits normal muscle tone. Coordination  normal.  Skin: Skin is warm and dry.  Psychiatric: She has a normal mood and affect. Her behavior is normal. Judgment and thought content normal.          Assessment & Plan:   Encounter Diagnosis  Name Primary?  . Acute pain of right knee Yes   PROCEDURE NOTE:  The patient requests injections of the right knee , verbal consent was obtained.  The right knee was prepped appropriately after time out was performed.   Sterile technique was observed and injection of 1 cc of Depo-Medrol 40 mg with several cc's of plain xylocaine. Anesthesia was provided by ethyl chloride and a 20-gauge needle was used to inject the knee area. The injection was tolerated well.  A band aid dressing was applied.  The patient was advised to apply ice later today and tomorrow to the injection sight as needed.  MRI is arranged.  Return after the MRI.  Continue the crutches. Pain medicine called in.  I have reviewed the [place] web site prior to prescribing narcotic medicine for this patient.    Call if any problem.  Precautions discussed.   Electronically Signed Sanjuana Kava, MD 11/12/201910:06 AM

## 2017-12-09 ENCOUNTER — Inpatient Hospital Stay: Admission: RE | Admit: 2017-12-09 | Payer: Medicare Other | Source: Ambulatory Visit

## 2017-12-10 ENCOUNTER — Ambulatory Visit: Payer: Medicare Other

## 2017-12-21 ENCOUNTER — Ambulatory Visit: Payer: Medicare Other | Admitting: Family Medicine

## 2018-01-04 ENCOUNTER — Ambulatory Visit
Admission: RE | Admit: 2018-01-04 | Discharge: 2018-01-04 | Disposition: A | Discharge status: Home / Self Care | Payer: Medicare Other | Source: Ambulatory Visit | Attending: Orthopaedic Surgery | Admitting: Orthopaedic Surgery

## 2018-01-04 DIAGNOSIS — M25561 Pain in right knee: Secondary | ICD-10-CM

## 2018-01-04 DIAGNOSIS — M75121 Complete rotator cuff tear or rupture of right shoulder, not specified as traumatic: Secondary | ICD-10-CM | POA: Diagnosis not present

## 2018-01-11 ENCOUNTER — Ambulatory Visit: Payer: Medicare Other | Admitting: Family Medicine

## 2018-01-11 ENCOUNTER — Ambulatory Visit (INDEPENDENT_AMBULATORY_CARE_PROVIDER_SITE_OTHER): Payer: Medicare Other | Admitting: Orthopaedic Surgery

## 2018-01-11 ENCOUNTER — Encounter: Payer: Self-pay | Admitting: Orthopaedic Surgery

## 2018-01-11 VITALS — BP 133/74 | HR 69 | Ht 61.0 in | Wt 141.0 lb

## 2018-01-11 DIAGNOSIS — S83241A Other tear of medial meniscus, current injury, right knee, initial encounter: Secondary | ICD-10-CM

## 2018-01-11 NOTE — Progress Notes (Signed)
Patient OI:[id number] Connie Colon, female DOB:09-20-1950, 67 y.o. DXI:[id number]  Chief Complaint  Patient presents with  . Knee Pain    right   . Results    review MRI     HPI  Connie Colon is a 67 y.o. female who has had pain of the right knee.  MRI was done and it showed: IMPRESSION: Complete radial tear root of the posterior horn of the medial meniscus. The tear extends peripheral to the root in a horizontal orientation reaching the meniscal undersurface.  Marrow edema about the medial compartment consistent with stress change. No fracture.  Fluid about the MCL consistent with grade 1 sprain.  No tear.  Mild to moderate osteoarthritis most notable in the patellofemoral and medial compartments.  Ruptured Baker's cyst with fluid dissecting along the gastrocnemius.  I have explained the findings to her and recommended arthroscopy of the knee.  She says she cannot have it done until after the first of the year.  I will schedule her to see Dr. Aline Brochure then.  She agrees.  Her knee gives way and still hurts.  I went over precautions with her.   Body mass index is 26.64 kg/m.  ROS  Review of Systems  Constitutional: Positive for activity change.  Musculoskeletal: Positive for arthralgias, gait problem and joint swelling.  All other systems reviewed and are negative.   All other systems reviewed and are negative.  The following is a summary of the past history medically, past history surgically, known current medicines, social history and family history.  This information is gathered electronically by the computer from prior information and documentation.  I review this each visit and have found including this information at this point in the chart is beneficial and informative.    Past Medical History:  Diagnosis Date  . Anxiety   . Carpal tunnel syndrome of left wrist   . Hyperlipemia   . Seasonal allergies   . Tinnitus     Past Surgical History:  Procedure  Laterality Date  . ABDOMINAL HYSTERECTOMY    . CESAREAN SECTION    . COLONOSCOPY N/A 11/13/2015   Procedure: COLONOSCOPY;  Surgeon: Rogene Houston, MD;  [facility];  Service: Endoscopy;  Laterality: N/A;  730  . HAND SURGERY  08/2010   benign tumor removed Left thumb  . TONSILLECTOMY      Family History  Problem Relation Age of Onset  . Hypertension Mother   . Heart attack Mother   . Hyperlipidemia Mother     Social History Social History   Tobacco Use  . Smoking status: Never Smoker  . Smokeless tobacco: Never Used  Substance Use Topics  . Alcohol use: No  . Drug use: No    No Known Allergies  Current Outpatient Medications  Medication Sig Dispense Refill  . ALPRAZolam (XANAX) 0.25 MG tablet TAKE 1 TABLET BY MOUTH TWICE DAILY ( Per Pt she takes 1/2 in the afternoon. 60 tablet 5  . atorvastatin (LIPITOR) 20 MG tablet TAKE 1 TABLET BY MOUTH EVERYDAY AT BEDTIME 90 tablet 0  . Biotin 300 MCG TABS Take by mouth. 600 mg daily.    . calcium-vitamin D (OSCAL WITH D) 500-200 MG-UNIT tablet Take 1 tablet by mouth. Per pt she takes 800 Qd.    . chlorzoxazone (PARAFON) 500 MG tablet TAKE 1/2 TABLET BY MOUTH UP TO 4 TIMES PER DAY 60 tablet 5  . cholecalciferol (VITAMIN D) 1000 UNITS tablet Take 1,000 Units by mouth daily.    Marland Kitchen  escitalopram (LEXAPRO) 10 MG tablet TAKE 1 TABLET BY MOUTH EVERY DAY 90 tablet 1  . estradiol (ESTRACE) 0.1 MG/GM vaginal cream Place 1 Applicatorful vaginally once a week. Per pt she takes twice a week.    Marland Kitchen HYDROcodone-acetaminophen (NORCO/VICODIN) 5-325 MG tablet One tablet every four hours as needed for acute pain.  Limit of five days per [place]. 30 tablet 0  . hydrocortisone 1 % lotion Apply BID to affected area. (Patient taking differently: Apply 1 application topically 2 (two) times daily as needed for itching. ) 118 mL 2  . metroNIDAZOLE (METROCREAM) 0.75 % cream APPLY TOPICALLY 2 TIMES DAILY TO FACE 45 g 5  . Multiple Vitamin  (MULITIVITAMIN WITH MINERALS) TABS Take 1 tablet by mouth daily. Women's One-A-Day    . Multiple Vitamins-Minerals (ICAPS) CAPS Take 1 capsule by mouth daily. Take on every day     . omega-3 acid ethyl esters (LOVAZA) 1 G capsule Take 1 g by mouth 3 (three) times daily.     . ranitidine (ZANTAC) 150 MG tablet Take 150 mg by mouth 2 (two) times daily.      No current facility-administered medications for this visit.      Physical Exam  Blood pressure 133/74, pulse 69, height 5\' 1"  (1.549 m), weight 141 lb (64 kg).  Constitutional: overall normal hygiene, normal nutrition, well developed, normal grooming, normal body habitus. Assistive device:none  Musculoskeletal: gait and station Limp right, muscle tone and strength are normal, no tremors or atrophy is present.  .  Neurological: coordination overall normal.  Deep tendon reflex/nerve stretch intact.  Sensation normal.  Cranial nerves II-XII intact.   Skin:   Normal overall no scars, lesions, ulcers or rashes. No psoriasis.  Psychiatric: Alert and oriented x 3.  Recent memory intact, remote memory unclear.  Normal mood and affect. Well groomed.  Good eye contact.  Cardiovascular: overall no swelling, no varicosities, no edema bilaterally, normal temperatures of the legs and arms, no clubbing, cyanosis and good capillary refill.  Lymphatic: palpation is normal.  Right knee has effusion, limp to the right, ROM 0 to 105, crepitus, medial positive McMurray.  NV intact.  All other systems reviewed and are negative   The patient has been educated about the nature of the problem(s) and counseled on treatment options.  The patient appeared to understand what I have discussed and is in agreement with it.  Encounter Diagnosis  Name Primary?  . Acute tear medial meniscus, right, initial encounter Yes    PLAN Call if any problems.  Precautions discussed.  Continue current medications.   Return to clinic to see Dr. Aline Brochure for possible  surgery of the right knee.   Electronically Signed Sanjuana Kava, MD 12/17/201911:11 AM

## 2018-01-31 ENCOUNTER — Encounter: Payer: Self-pay | Admitting: Orthopedic Surgery

## 2018-01-31 ENCOUNTER — Ambulatory Visit (INDEPENDENT_AMBULATORY_CARE_PROVIDER_SITE_OTHER): Payer: Medicare Other | Admitting: Orthopedic Surgery

## 2018-01-31 VITALS — BP 137/79 | HR 87 | Ht 61.0 in | Wt 141.0 lb

## 2018-01-31 DIAGNOSIS — M23321 Other meniscus derangements, posterior horn of medial meniscus, right knee: Secondary | ICD-10-CM | POA: Diagnosis not present

## 2018-01-31 DIAGNOSIS — M1710 Unilateral primary osteoarthritis, unspecified knee: Secondary | ICD-10-CM | POA: Diagnosis not present

## 2018-01-31 DIAGNOSIS — M8430XA Stress fracture, unspecified site, initial encounter for fracture: Secondary | ICD-10-CM | POA: Diagnosis not present

## 2018-01-31 NOTE — Patient Instructions (Signed)
Meniscus Injury, Arthroscopy   Arthroscopy is a surgical procedure that involves the use of a small scope that has a camera and surgical instruments on the end (arthroscope). An arthroscope can be used to repair your meniscus injury.  LET YOUR HEALTH CARE PROVIDER KNOW ABOUT:  Any allergies you have.  All medicines you are taking, including vitamins, herbs, eyedrops, creams, and over-the-counter medicines.  Any recent colds or infections you have had or currently have.  Previous problems you or members of your family have had with the use of anesthetics.  Any blood disorders or blood clotting problems you have.  Previous surgeries you have had.  Medical conditions you have. RISKS AND COMPLICATIONS Generally, this is a safe procedure. However, as with any procedure, problems can occur. Possible problems include:  Damage to nerves or blood vessels.  Excess bleeding.  Blood clots.  Infection. BEFORE THE PROCEDURE  Do not eat or drink for 6-8 hours before the procedure.  Take medicines as directed by your surgeon. Ask your surgeon about changing or stopping your regular medicines.  You may have lab tests the morning of surgery. PROCEDURE  You will be given one of the following:   A medicine that numbs the area (local anesthesia).  A medicine that makes you go to sleep (general anesthesia).  A medicine injected into your spine that numbs your body below the waist (spinal anesthesia). Most often, several small cuts (incisions) are made in the knee. The arthroscope and instruments go into the incisions to repair the damage. The torn portion of the meniscus is removed.   AFTER THE PROCEDURE  You will be taken to the recovery area where your progress will be monitored. When you are awake, stable, and taking fluids without complications, you will be allowed to go home. This is usually the same day. A torn or stretched ligament (ligament sprain) may take 6-8 weeks to heal.   It  takes about the 4-6 WEEKS if your surgeon removed a torn meniscus.  A repaired meniscus may require 6-12 weeks of recovery time.  A torn ligament needing reconstructive surgery may take 6-12 months to heal fully.   This information is not intended to replace advice given to you by your health care provider. Make sure you discuss any questions you have with your health care provider. You have decided to proceed with operative arthroscopy of the knee. You have decided not to continue with nonoperative measures such as but not limited to oral medication, weight loss, activity modification, physical therapy, bracing, or injection.  We will perform operative arthroscopy of the knee. Some of the risks associated with arthroscopic surgery of the knee include but are not limited to Bleeding Infection Swelling Stiffness Blood clot Pain Need for knee replacement surgery    In compliance with recent [place] law in federal regulation regarding opioid use and abuse and addiction, we will taper (stop) opioid medication after 2 weeks.  If you're not comfortable with these risks and would like to continue with nonoperative treatment please let Dr. Azuri Bozard know prior to your surgery. 

## 2018-01-31 NOTE — Progress Notes (Signed)
PREOP CONSULT/REFERRAL INTRA-OFFICE FROM DR Tyrone Apple   Chief Complaint  Patient presents with  . Knee Pain    right knee discuss surgery     68 year old female started having pain in her knee last year and then on November 11 [employer] she was in [place] stood up and something popped in the back of her knee since that time she had progressively increasing medial knee pain which was relieved partially with injection however her knee is still uncomfortable she presents for evaluation and treatment after MRI showed torn medial meniscus, stress reaction medial tibial plateau and osteoarthritis of the patellofemoral joint in the medial compartment  Body of pain is a dull ache worse at night Severity moderate Associated with difficulty stair climbing and descent causing altered gait on stairs   Review of Systems  Constitutional: Negative for chills and fever.  HENT: Positive for congestion.   Respiratory: Negative for shortness of breath.   All other systems reviewed and are negative.    Past Medical History:  Diagnosis Date  . Anxiety   . Carpal tunnel syndrome of left wrist   . Hyperlipemia   . Seasonal allergies   . Tinnitus     Past Surgical History:  Procedure Laterality Date  . ABDOMINAL HYSTERECTOMY    . CESAREAN SECTION    . COLONOSCOPY N/A 11/13/2015   Procedure: COLONOSCOPY;  Surgeon: Rogene Houston, MD;  [facility];  Service: Endoscopy;  Laterality: N/A;  730  . HAND SURGERY  08/2010   benign tumor removed Left thumb  . TONSILLECTOMY      Family History  Problem Relation Age of Onset  . Hypertension Mother   . Heart attack Mother   . Hyperlipidemia Mother    Social History   Tobacco Use  . Smoking status: Never Smoker  . Smokeless tobacco: Never Used  Substance Use Topics  . Alcohol use: No  . Drug use: No    No Known Allergies   Current Meds  Medication Sig  . ALPRAZolam (XANAX) 0.25 MG tablet TAKE 1 TABLET BY MOUTH TWICE  DAILY ( Per Pt she takes 1/2 in the afternoon.  Marland Kitchen atorvastatin (LIPITOR) 20 MG tablet TAKE 1 TABLET BY MOUTH EVERYDAY AT BEDTIME  . Biotin 300 MCG TABS Take by mouth. 600 mg daily.  . chlorzoxazone (PARAFON) 500 MG tablet TAKE 1/2 TABLET BY MOUTH UP TO 4 TIMES PER DAY  . cholecalciferol (VITAMIN D) 1000 UNITS tablet Take 1,000 Units by mouth daily.  Marland Kitchen escitalopram (LEXAPRO) 10 MG tablet TAKE 1 TABLET BY MOUTH EVERY DAY  . estradiol (ESTRACE) 0.1 MG/GM vaginal cream Place 1 Applicatorful vaginally once a week. Per pt she takes twice a week.  . famotidine (PEPCID) 20 MG tablet Take 20 mg by mouth 2 (two) times daily.  Marland Kitchen HYDROcodone-acetaminophen (NORCO/VICODIN) 5-325 MG tablet One tablet every four hours as needed for acute pain.  Limit of five days per [place].  . hydrocortisone 1 % lotion Apply BID to affected area. (Patient taking differently: Apply 1 application topically 2 (two) times daily as needed for itching. )  . metroNIDAZOLE (METROCREAM) 0.75 % cream APPLY TOPICALLY 2 TIMES DAILY TO FACE  . Multiple Vitamin (MULITIVITAMIN WITH MINERALS) TABS Take 1 tablet by mouth daily. Women's One-A-Day  . Multiple Vitamins-Minerals (ICAPS) CAPS Take 1 capsule by mouth daily. Take on every day   . omega-3 acid ethyl esters (LOVAZA) 1 G capsule Take 1 g by mouth 3 (three) times  daily.     BP 137/79   Pulse 87   Ht 5\' 1"  (1.549 m)   Wt 141 lb (64 kg)   BMI 26.64 kg/m   Physical Exam Vitals signs reviewed.  Constitutional:      Appearance: She is well-developed.  Musculoskeletal:     Right knee: She exhibits effusion.  Neurological:     Mental Status: She is alert and oriented to person, place, and time.     Gait: Gait is intact. Gait normal.  Psychiatric:        Attention and Perception: Attention normal.        Mood and Affect: Mood and affect normal.        Speech: Speech normal.        Behavior: Behavior normal.        Thought Content: Thought content normal.         Judgment: Judgment normal.     Right Knee Exam   Muscle Strength  The patient has normal right knee strength.  Tenderness  The patient is experiencing tenderness in the medial joint line.  Range of Motion  Extension: normal  Flexion: normal   Tests  McMurray:  Medial - positive Lateral - negative Varus: negative Valgus: negative Drawer:  Anterior - negative    Posterior - negative Patellar apprehension: negative  Other  Erythema: absent Scars: absent Sensation: normal Pulse: present Swelling: none Effusion: effusion present  Comments:  Normal range of motion right hip   Left Knee Exam   Muscle Strength  The patient has normal left knee strength.  Tenderness  The patient is experiencing no tenderness.   Range of Motion  Extension: normal  Flexion: normal   Tests  McMurray:  Medial - negative Lateral - negative Varus: negative Valgus: negative Drawer:  Anterior - negative     Posterior - negative Patellar apprehension: negative  Other  Erythema: absent Scars: absent Sensation: normal Pulse: present Swelling: none  Comments:  Normal range of motion left      MEDICAL DECISION SECTION  xrays ordered? mri  My independent reading of xrays: Torn medial meniscus chondral degeneration medial compartment patellofemoral compartment Baker's cyst ruptured  X-ray shows normal alignment to the right knee but she is trending towards varus.  No secondary bone changes seen on x-ray and she has a slight amount of narrowing on the medial side   MRI  IMPRESSION: Complete radial tear root of the posterior horn of the medial meniscus. The tear extends peripheral to the root in a horizontal orientation reaching the meniscal undersurface.   Marrow edema about the medial compartment consistent with stress change. No fracture.   Fluid about the MCL consistent with grade 1 sprain.  No tear.   Mild to moderate osteoarthritis most notable in the patellofemoral and  medial compartments.   Ruptured Baker's cyst with fluid dissecting along the gastrocnemius.     Electronically Signed   By: Inge Rise M.D.   On: 01/04/2018 11:49      Encounter Diagnoses  Name Primary?  . Derangement of posterior horn of medial meniscus of right knee Yes  . Primary localized osteoarthritis of knee   . Stress reaction of bone      PLAN:   Surgical procedure planned: Arthroscopy right knee with torn medial meniscus stress reaction medial tibial plateau chondral changes of the patellofemoral joint and medial compartment with ruptured Baker's cyst  She was counseled that she will need a brace after surgery for 6  weeks to treat the stress reaction and she will still have arthritis and arthritic pain in the right knee.  She asked about possibility of knee replacement in the future and although this is not 100% guaranteed she will probably come to knee replacement in the future  She will be out of driving for 1 week  Therapy at home  The procedure has been fully reviewed with the patient; The risks and benefits of surgery have been discussed and explained and understood. Alternative treatment has also been reviewed, questions were encouraged and answered. The postoperative plan is also been reviewed.  Nonsurgical treatment as described in the history and physical section was attempted and unsuccessful and the patient has agreed to proceed with surgical intervention to improve their situation.  No orders of the defined types were placed in this encounter.   Arther Abbott, MD 01/31/2018 8:53 AM

## 2018-02-08 ENCOUNTER — Encounter: Payer: Self-pay | Admitting: Family Medicine

## 2018-02-08 ENCOUNTER — Ambulatory Visit (INDEPENDENT_AMBULATORY_CARE_PROVIDER_SITE_OTHER): Payer: Medicare Other | Admitting: Family Medicine

## 2018-02-08 ENCOUNTER — Other Ambulatory Visit: Payer: Self-pay | Admitting: Family Medicine

## 2018-02-08 VITALS — BP 132/88 | Ht 61.0 in | Wt 144.0 lb

## 2018-02-08 DIAGNOSIS — I10 Essential (primary) hypertension: Secondary | ICD-10-CM | POA: Diagnosis not present

## 2018-02-08 DIAGNOSIS — G250 Essential tremor: Secondary | ICD-10-CM

## 2018-02-08 DIAGNOSIS — Z79899 Other long term (current) drug therapy: Secondary | ICD-10-CM

## 2018-02-08 DIAGNOSIS — E785 Hyperlipidemia, unspecified: Secondary | ICD-10-CM

## 2018-02-08 DIAGNOSIS — F411 Generalized anxiety disorder: Secondary | ICD-10-CM | POA: Diagnosis not present

## 2018-02-08 MED ORDER — ESCITALOPRAM OXALATE 10 MG PO TABS: 10.0000 mg | ORAL_TABLET | Freq: Every day | ORAL | 1 refills | Status: DC

## 2018-02-08 MED ORDER — ATORVASTATIN CALCIUM 20 MG PO TABS: ORAL_TABLET | ORAL | 5 refills | Status: DC

## 2018-02-08 NOTE — Progress Notes (Signed)
   Subjective:    Patient ID: Connie Colon, female    DOB: 1950/07/16, 68 y.o.   MRN: [id number]  Hyperlipidemia  This is a chronic problem. The current episode started more than 1 year ago.   Pt changed to pepcid from zantac.   Wants ears checked for ear wax.   Having right knee surgery on feb 20th with dr Aline Brochure.   Would like to know if its ok to take multi vit and icap together.   Patient continues to take lipid medication regularly. No obvious side effects from it. Generally does not miss a dose. Prior blood work results are reviewed with patient. Patient continues to work on fat intake in diet  Anxiety disorder.  Certainly ongoing.  Patient states she definitely needs her medication in order to function well.  No major aspect of depression.  Generally the combination of the Lexapro plus Xanax helps control symptomatology.  Pt reports eating out a lot of fast goods due to just moved    Exercise has sklowed dowh with the injury to the knee  Xanax also helps the   eseential tremors.  Continues to have them.  Has not seemed to worsen much.  Not affecting ability to do what she needs to do.    Overall reports anxiety is btter , calms down ofter the holiday   Review of Systems .rs No headache, no major weight loss or weight gain, no chest pain no back pain abdominal pain no change in bowel habits complete ROS otherwise negative     Objective:   Physical Exam  Alert and oriented, vitals reviewed and stable, NAD ENT-TM's and ext canals WNL bilat via otoscopic exam Soft palate, tonsils and post pharynx WNL via oropharyngeal exam Neck-symmetric, no masses; thyroid nonpalpable and nontender Pulmonary-no tachypnea or accessory muscle use; Clear without wheezes via auscultation Card--no abnrml murmurs, rhythm reg and rate WNL Carotid pulses symmetric, without bruits Fine essential tremor noted in hands      Assessment & Plan:  Impression #1 hyperlipidemia.  Current  control uncertain.  Prior blood work reviewed.  Medications refilled.  Diet discussed.  2.  Essential hypertension.  Clinically stable.  Blood pressures good on no medications.  3.  Essential tremor.  Clinically stable discussed to maintain same  4.  Chronic anxiety fairly profound at times.  Generally under good control now.  Discussed medications refilled  Follow-up in 6 months diet exercise discussed/appropriate blood work

## 2018-02-21 ENCOUNTER — Ambulatory Visit: Payer: Medicare Other | Admitting: Orthopedic Surgery

## 2018-03-08 DIAGNOSIS — H43812 Vitreous degeneration, left eye: Secondary | ICD-10-CM | POA: Diagnosis not present

## 2018-03-08 DIAGNOSIS — H40003 Preglaucoma, unspecified, bilateral: Secondary | ICD-10-CM | POA: Diagnosis not present

## 2018-03-08 DIAGNOSIS — H40013 Open angle with borderline findings, low risk, bilateral: Secondary | ICD-10-CM | POA: Diagnosis not present

## 2018-03-08 DIAGNOSIS — H18893 Other specified disorders of cornea, bilateral: Secondary | ICD-10-CM | POA: Diagnosis not present

## 2018-03-09 NOTE — Patient Instructions (Signed)
Connie Colon  03/09/2018     @PREFPERIOPPHARMACY @   Your procedure is scheduled on  03/17/2018.  Report to [name] at  615   A.M.  Call this number if you have problems the morning of surgery:  [phone]   Remember:  Do not eat or drink after midnight.                        Take these medicines the morning of surgery with A SIP OF WATER  Lexapro, pepcid.    Do not wear jewelry, make-up or nail polish.  Do not wear lotions, powders, or perfumes, or deodorant.  Do not shave 48 hours prior to surgery.  Men may shave face and neck.  Do not bring valuables to the hospital.  [facility] is not responsible for any belongings or valuables.  Contacts, dentures or bridgework may not be worn into surgery.  Leave your suitcase in the car.  After surgery it may be brought to your room.  For patients admitted to the hospital, discharge time will be determined by your treatment team.  Patients discharged the day of surgery will not be allowed to drive home.   Name and phone number of your driver:   family Special instructions:  None  Please read over the following fact sheets that you were given. Anesthesia Post-op Instructions and Care and Recovery After Surgery       Arthroscopic Knee Ligament Repair  Arthroscopic knee ligament repair is a procedure to repair a tear in one or more of the tough, cord-like tissues that connect bones (ligaments) in the knee. This procedure is done by placing a thin tube with a light and camera on the end (arthroscope) through a small incision in the knee. The arthroscope sends images to a monitor in the operating room, and the images are used to help perform the surgery. This procedure is less invasive than open knee surgery. Tell a health care provider about:  Any allergies you have.  All medicines you are taking, including vitamins, herbs, eye drops, creams, and over-the-counter medicines.  Any problems you or family  members have had with anesthetic medicines.  Any blood disorders you have.  Any surgeries you have had.  Any medical conditions you have.  Whether you are pregnant or may be pregnant. What are the risks? Generally, this is a safe procedure. However, problems may occur, including:  Infection.  Bleeding or blood clot.  Allergic reactions to medicines or dyes.  Damage to blood vessels, nerves, or other structures of the knee.  Stiffness.  Failure to relieve symptoms.  Buildup of pressure and swelling in spaces in the leg (compartment syndrome). This is rare. What happens before the procedure? Medicines Ask your health care provider about:  Changing or stopping your regular medicines. This is especially important if you are taking diabetes medicines or blood thinners.  Taking medicines such as aspirin and ibuprofen. These medicines can thin your blood. Do not take these medicines before your procedure if your health care provider instructs you not to. Staying hydrated Follow instructions from your health care provider about hydration, which may include:  Up to 2 hours before the procedure - you may continue to drink clear liquids, such as water, clear fruit juice, black coffee, and plain tea. Eating and drinking restrictions Follow instructions from your health care provider about eating and drinking, which may include:  8 hours before the procedure - stop eating heavy meals or foods such as meat, fried foods, or fatty foods.  6 hours before the procedure - stop eating light meals or foods, such as toast or cereal.  6 hours before the procedure - stop drinking milk or drinks that contain milk.  2 hours before the procedure - stop drinking clear liquids. General instructions  You may have a physical exam of your knee.  You may have some imaging tests of your knee, such as X-rays or MRI.  Do not use any products that contain nicotine or tobacco for as long as directed  before your procedure. This includes cigarettes and e-cigarettes. If you need help quitting, ask your health care provider.  You may be asked to shower with a germ-killing soap.  Ask your health care provider how your surgical site will be marked or identified.  Plan to have someone take you home from the hospital or clinic.  If you will be going home right after the procedure, plan to have someone with you for 24 hours. What happens during the procedure?  To lower your risk of infection: ? Your health care team will wash or sanitize their hands. ? Your skin will be washed with soap.  Small monitors will be put on your body. They are used to check your heart, blood pressure, and oxygen levels.  An IV tube will be inserted into one of your veins.  You may be given one or more of the following: ? A medicine to help you relax (sedative). ? A medicine to make you fall asleep (general anesthetic).  A breathing tube will be placed down your throat and into your lungs to help you breathe during the procedure.  A cuff may be placed around your upper leg to slow bleeding during the procedure.  Several small incisions will be made in your knee.  Your knee joint will be flushed and filled with a germ-free saltwater solution (sterile saline). This expands the knee joint and clears any blood, which lets your surgeon see your knee more clearly.  An arthroscope will be inserted through one of the incisions to examine your knee.  Surgical instruments will be inserted through the other incisions to repair injured tissue. Injured tissue will be stitched (sutured) together, and in some cases tissue may be removed.  Sterile saline will be removed from your knee.  Your incisions will be closed with absorbable sutures and covered with bandages (dressings).  Your knee may be placed in a brace or immobilizer at the end of the procedure or right after the procedure. The procedure may vary among health  care providers and hospitals. What happens after the procedure?  Your blood pressure, heart rate, breathing rate and blood oxygen level will be monitored until the medicines you were given have worn off.  You may be given medicine for pain.  You may get crutches to help you walk without supporting your body weight on your knee.  You may have to wear compression stockings. These stockings help to prevent blood clots and reduce swelling in your legs.  Do not drive until your health care provider approves.  You will work with a physical therapist to determine the best course of rehab (rehabilitation) for you. Rehab is very important after this procedure. Summary  Arthroscopic knee ligament repair is a procedure to repair a tear in one or more of the tough, cord-like tissues that connect bones (ligaments) in the knee. This procedure  is less invasive than open knee surgery.  Plan to have someone take you home from the hospital or clinic.  Do not use any products that contain nicotine or tobacco for as long as directed before your procedure. This includes cigarettes and e-cigarettes. If you need help quitting, ask your health care provider.  You will work with a physical therapist to determine the best course of rehab (rehabilitation) for you. Rehab is very important after this procedure. This information is not intended to replace advice given to you by your health care provider. Make sure you discuss any questions you have with your health care provider. Document Released: 01/10/2000 Document Revised: 02/20/2016 Document Reviewed: 02/20/2016 Elsevier Interactive Patient Education  2019 [place].  Arthroscopic Knee Ligament Repair, Care After This sheet gives you information about how to care for yourself after your procedure. Your health care provider may also give you more specific instructions. If you have problems or questions, contact your health care provider. What can I expect  after the procedure? After the procedure, it is common to have:  Pain in your knee.  Bruising and swelling on your knee, calf, and ankle for 3-4 days.  Fatigue. Follow these instructions at home: If you have a brace or immobilizer:  Wear the brace or immobilizer as told by your health care provider. Remove it only as told by your health care provider.  Loosen the splint or immobilizer if your toes tingle, become numb, or turn cold and blue.  Keep the brace or immobilizer clean. Bathing  Do not take baths, swim, or use a hot tub until your health care provider approves. Ask your health care provider if you can take showers.  Keep your bandage (dressing) dry until your health care provider says that it can be removed. Cover it and your brace or immobilizer with a watertight covering when you take a shower. Incision care   Follow instructions from your health care provider about how to take care of your incision. Make sure you: ? Wash your hands with soap and water before you change your bandage (dressing). If soap and water are not available, use hand sanitizer. ? Change your dressing as told by your health care provider. ? Leave stitches (sutures), skin glue, or adhesive strips in place. These skin closures may need to stay in place for 2 weeks or longer. If adhesive strip edges start to loosen and curl up, you may trim the loose edges. Do not remove adhesive strips completely unless your health care provider tells you to do that.  Check your incision area every day for signs of infection. Check for: ? More redness, swelling, or pain. ? More fluid or blood. ? Warmth. ? Pus or a bad smell. Managing pain, stiffness, and swelling   If directed, put ice on the affected area. ? If you have a removable brace or immobilizer, remove it as told by your health care provider. ? Put ice in a plastic bag. ? Place a towel between your skin and the bag or between your brace or immobilizer and  the bag. ? Leave the ice on for 20 minutes, 2-3 times a day.  Move your toes often to avoid stiffness and to lessen swelling.  Raise (elevate) the injured area above the level of your heart while you are sitting or lying down. Driving  Do not drive until your health care provider approves. If you have a brace or immobilizer on your leg, ask your health care provider when  it is safe for you to drive.  Do not drive or use heavy machinery while taking prescription pain medicine. Activity  Rest as directed. Ask your health care provider what activities are safe for you.  Do physical therapy exercises as told by your health care provider. Physical therapy will help you regain strength and motion in your knee.  Follow instructions from your health care provider about: ? When you may start motion exercises. ? When you may start riding a stationary bike and doing other low-impact activities. ? When you may start to jog and do other high-impact activities. Safety  Do not use the injured limb to support your body weight until your health care provider says that you can. Use crutches as told by your health care provider. General instructions  Do not use any products that contain nicotine or tobacco, such as cigarettes and e-cigarettes. These can delay bone healing. If you need help quitting, ask your health care provider.  To prevent or treat constipation while you are taking prescription pain medicine, your health care provider may recommend that you: ? Drink enough fluid to keep your urine clear or pale yellow. ? Take over-the-counter or prescription medicines. ? Eat foods that are high in fiber, such as fresh fruits and vegetables, whole grains, and beans. ? Limit foods that are high in fat and processed sugars, such as fried and sweet foods.  Take over-the-counter and prescription medicines only as told by your health care provider.  Keep all follow-up visits as told by your health care  provider. This is important. Contact a health care provider if:  You have more redness, swelling, or pain around an incision.  You have more fluid or blood coming from an incision.  Your incision feels warm to the touch.  You have a fever.  You have pain or swelling in your knee, and it gets worse.  You have pain that does not get better with medicine. Get help right away if:  You have trouble breathing.  You have pus or a bad smell coming from an incision.  You have numbness and tingling near the knee joint. Summary  After the procedure, it is common to have knee pain with bruising and swelling on your knee, calf, and ankle.  Icing your knee and raising your leg above the level of your heart will help control the pain and the swelling.  Do physical therapy exercises as told by your health care provider. Physical therapy will help you regain strength and motion in your knee. This information is not intended to replace advice given to you by your health care provider. Make sure you discuss any questions you have with your health care provider. Document Released: 11/02/2012 Document Revised: 01/07/2016 Document Reviewed: 01/07/2016 Elsevier Interactive Patient Education  2019 [place] Anesthesia, Adult General anesthesia is the use of medicines to make a person "go to sleep" (unconscious) for a medical procedure. General anesthesia must be used for certain procedures, and is often recommended for procedures that:  Last a long time.  Require you to be still or in an unusual position.  Are major and can cause blood loss. The medicines used for general anesthesia are called general anesthetics. As well as making you unconscious for a certain amount of time, these medicines:  Prevent pain.  Control your blood pressure.  Relax your muscles. Tell a health care provider about:  Any allergies you have.  All medicines you are taking, including vitamins, herbs,  eye drops, creams, and over-the-counter medicines.  Any problems you or family members have had with anesthetic medicines.  Types of anesthetics you have had in the past.  Any blood disorders you have.  Any surgeries you have had.  Any medical conditions you have.  Any recent upper respiratory, chest, or ear infections.  Any history of: ? Heart or lung conditions, such as heart failure, sleep apnea, asthma, or chronic obstructive pulmonary disease (COPD). ? [employer] forces logistics/support/administrative officer. ? Depression or anxiety.  Any tobacco or drug use, including marijuana or alcohol use.  Whether you are pregnant or may be pregnant. What are the risks? Generally, this is a safe procedure. However, problems may occur, including:  Allergic reaction.  Lung and heart problems.  Inhaling food or liquid from the stomach into the lungs (aspiration).  Nerve injury.  Dental injury.  Air in the bloodstream, which can lead to stroke.  Extreme agitation or confusion (delirium) when you wake up from the anesthetic.  Waking up during your procedure and being unable to move. This is rare. These problems are more likely to develop if you are having a major surgery or if you have an advanced or serious medical condition. You can prevent some of these complications by answering all of your health care provider's questions thoroughly and by following all instructions before your procedure. General anesthesia can cause side effects, including:  Nausea or vomiting.  A sore throat from the breathing tube.  Hoarseness.  Wheezing or coughing.  Shaking chills.  Tiredness.  Body aches.  Anxiety.  Sleepiness or drowsiness.  Confusion or agitation. What happens before the procedure? Staying hydrated Follow instructions from your health care provider about hydration, which may include:  Up to 2 hours before the procedure - you may continue to drink clear liquids, such as water, clear fruit juice, black  coffee, and plain tea.  Eating and drinking restrictions Follow instructions from your health care provider about eating and drinking, which may include:  8 hours before the procedure - stop eating heavy meals or foods such as meat, fried foods, or fatty foods.  6 hours before the procedure - stop eating light meals or foods, such as toast or cereal.  6 hours before the procedure - stop drinking milk or drinks that contain milk.  2 hours before the procedure - stop drinking clear liquids. Medicines Ask your health care provider about:  Changing or stopping your regular medicines. This is especially important if you are taking diabetes medicines or blood thinners.  Taking medicines such as aspirin and ibuprofen. These medicines can thin your blood. Do not take these medicines unless your health care provider tells you to take them.  Taking over-the-counter medicines, vitamins, herbs, and supplements. Do not take these during the week before your procedure unless your health care provider approves them. General instructions  Starting 3-6 weeks before the procedure, do not use any products that contain nicotine or tobacco, such as cigarettes and e-cigarettes. If you need help quitting, ask your health care provider.  If you brush your teeth on the morning of the procedure, make sure to spit out all of the toothpaste.  Tell your health care provider if you become ill or develop a cold, cough, or fever.  If instructed by your health care provider, bring your sleep apnea device with you on the day of your surgery (if applicable).  Ask your health care provider if you will be going home the same day, the following day, or  after a longer hospital stay. ? Plan to have someone take you home from the hospital or clinic. ? Plan to have a responsible adult care for you for at least 24 hours after you leave the hospital or clinic. This is important. What happens during the procedure?   You will  be given anesthetics through both of the following: ? A mask placed over your nose and mouth. ? An IV in one of your veins.  You may receive a medicine to help you relax (sedative).  After you are unconscious, a breathing tube may be inserted down your throat to help you breathe. This will be removed before you wake up.  An anesthesia specialist will stay with you throughout your procedure. He or she will: ? Keep you comfortable and safe by continuing to give you medicines and adjusting the amount of medicine that you get. ? Monitor your blood pressure, pulse, and oxygen levels to make sure that the anesthetics do not cause any problems. The procedure may vary among health care providers and hospitals. What happens after the procedure?  Your blood pressure, temperature, heart rate, breathing rate, and blood oxygen level will be monitored until the medicines you were given have worn off.  You will wake up in a recovery area. You may wake up slowly.  If you feel anxious or agitated, you may be given medicine to help you calm down.  If you will be going home the same day, your health care provider may check to make sure you can walk, drink, and urinate.  Your health care provider will treat any pain or side effects you have before you go home.  Do not drive for 24 hours if you were given a sedative. Summary  General anesthesia is used to keep you still and prevent pain during a procedure.  It is important to tell your health care provider about your medical history and any surgeries you have had, and previous experience with anesthesia.  Follow your health care provider's instructions about when to stop eating, drinking, or taking certain medicines before your procedure.  Plan to have someone take you home from the hospital or clinic. This information is not intended to replace advice given to you by your health care provider. Make sure you discuss any questions you have with your  health care provider. Document Released: 04/21/2007 Document Revised: 06/01/2017 Document Reviewed: 08/28/2016 Elsevier Interactive Patient Education  2019 [place] Anesthesia, Adult, Care After This sheet gives you information about how to care for yourself after your procedure. Your health care provider may also give you more specific instructions. If you have problems or questions, contact your health care provider. What can I expect after the procedure? After the procedure, the following side effects are common:  Pain or discomfort at the IV site.  Nausea.  Vomiting.  Sore throat.  Trouble concentrating.  Feeling cold or chills.  Weak or tired.  Sleepiness and fatigue.  Soreness and body aches. These side effects can affect parts of the body that were not involved in surgery. Follow these instructions at home:  For at least 24 hours after the procedure:  Have a responsible adult stay with you. It is important to have someone help care for you until you are awake and alert.  Rest as needed.  Do not: ? Participate in activities in which you could fall or become injured. ? Drive. ? Use heavy machinery. ? Drink alcohol. ? Take sleeping pills or medicines that  cause drowsiness. ? Make important decisions or sign legal documents. ? Take care of children on your own. Eating and drinking  Follow any instructions from your health care provider about eating or drinking restrictions.  When you feel hungry, start by eating small amounts of foods that are soft and easy to digest (bland), such as toast. Gradually return to your regular diet.  Drink enough fluid to keep your urine pale yellow.  If you vomit, rehydrate by drinking water, juice, or clear broth. General instructions  If you have sleep apnea, surgery and certain medicines can increase your risk for breathing problems. Follow instructions from your health care provider about wearing your sleep  device: ? Anytime you are sleeping, including during daytime naps. ? While taking prescription pain medicines, sleeping medicines, or medicines that make you drowsy.  Return to your normal activities as told by your health care provider. Ask your health care provider what activities are safe for you.  Take over-the-counter and prescription medicines only as told by your health care provider.  If you smoke, do not smoke without supervision.  Keep all follow-up visits as told by your health care provider. This is important. Contact a health care provider if:  You have nausea or vomiting that does not get better with medicine.  You cannot eat or drink without vomiting.  You have pain that does not get better with medicine.  You are unable to pass urine.  You develop a skin rash.  You have a fever.  You have redness around your IV site that gets worse. Get help right away if:  You have difficulty breathing.  You have chest pain.  You have blood in your urine or stool, or you vomit blood. Summary  After the procedure, it is common to have a sore throat or nausea. It is also common to feel tired.  Have a responsible adult stay with you for the first 24 hours after general anesthesia. It is important to have someone help care for you until you are awake and alert.  When you feel hungry, start by eating small amounts of foods that are soft and easy to digest (bland), such as toast. Gradually return to your regular diet.  Drink enough fluid to keep your urine pale yellow.  Return to your normal activities as told by your health care provider. Ask your health care provider what activities are safe for you. This information is not intended to replace advice given to you by your health care provider. Make sure you discuss any questions you have with your health care provider. Document Released: 04/20/2000 Document Revised: 08/28/2016 Document Reviewed: 08/28/2016 Elsevier  Interactive Patient Education  2019 [employer].

## 2018-03-10 ENCOUNTER — Encounter (HOSPITAL_COMMUNITY)
Admission: RE | Admit: 2018-03-10 | Discharge: 2018-03-10 | Disposition: A | Discharge status: Home / Self Care | Payer: Medicare Other | Source: Ambulatory Visit | Attending: Orthopedic Surgery | Admitting: Orthopedic Surgery

## 2018-03-10 ENCOUNTER — Other Ambulatory Visit: Payer: Self-pay

## 2018-03-10 ENCOUNTER — Other Ambulatory Visit (HOSPITAL_COMMUNITY)
Admission: RE | Admit: 2018-03-10 | Discharge: 2018-03-10 | Disposition: A | Discharge status: Home / Self Care | Payer: Medicare Other | Source: Ambulatory Visit | Attending: *Deleted | Admitting: *Deleted

## 2018-03-10 ENCOUNTER — Encounter (HOSPITAL_COMMUNITY): Payer: Self-pay

## 2018-03-10 DIAGNOSIS — I10 Essential (primary) hypertension: Secondary | ICD-10-CM | POA: Diagnosis not present

## 2018-03-10 DIAGNOSIS — Z01812 Encounter for preprocedural laboratory examination: Secondary | ICD-10-CM | POA: Insufficient documentation

## 2018-03-10 HISTORY — DX: Gastro-esophageal reflux disease without esophagitis: K21.9

## 2018-03-10 HISTORY — DX: Unspecified osteoarthritis, unspecified site: M19.90

## 2018-03-10 LAB — BASIC METABOLIC PANEL
Anion gap: 7 (ref 5–15)
BUN: 12 mg/dL (ref 8–23)
CO2: 26 mmol/L (ref 22–32)
Calcium: 9.1 mg/dL (ref 8.9–10.3)
Chloride: 107 mmol/L (ref 98–111)
Creatinine, Ser: 0.66 mg/dL (ref 0.44–1.00)
GFR calc Af Amer: >60 mL/min (ref >60)
GFR calc non Af Amer: >60 mL/min (ref >60)
Glucose, Bld: 106 mg/dL — ABNORMAL HIGH (ref 70–99)
POTASSIUM: 4.5 mmol/L (ref 3.5–5.1)
Sodium: 140 mmol/L (ref 135–145)

## 2018-03-10 LAB — CBC WITH DIFFERENTIAL/PLATELET
Abs Immature Granulocytes: 0.02 10*3/uL (ref 0.00–0.07)
Basophils Absolute: 0.1 10*3/uL (ref 0.0–0.1)
Basophils Relative: 1 %
Eosinophils Absolute: 0.1 10*3/uL (ref 0.0–0.5)
Eosinophils Relative: 2 %
HEMATOCRIT: 44.1 % (ref 36.0–46.0)
Hemoglobin: 14.2 g/dL (ref 12.0–15.0)
Immature Granulocytes: 0 %
LYMPHS ABS: 2 10*3/uL (ref 0.7–4.0)
Lymphocytes Relative: 25 %
MCH: 28.6 pg (ref 26.0–34.0)
MCHC: 32.2 g/dL (ref 30.0–36.0)
MCV: 88.9 fL (ref 80.0–100.0)
Monocytes Absolute: 0.5 10*3/uL (ref 0.1–1.0)
Monocytes Relative: 6 %
NEUTROS ABS: 5.1 10*3/uL (ref 1.7–7.7)
Neutrophils Relative %: 66 %
Platelets: 344 10*3/uL (ref 150–400)
RBC: 4.96 MIL/uL (ref 3.87–5.11)
RDW: 12.1 % (ref 11.5–15.5)
WBC: 7.8 10*3/uL (ref 4.0–10.5)
nRBC: 0 % (ref 0.0–0.2)

## 2018-03-10 LAB — HEPATIC FUNCTION PANEL
ALBUMIN: 3.9 g/dL (ref 3.5–5.0)
ALK PHOS: 67 U/L (ref 38–126)
ALT: 17 U/L (ref 0–44)
AST: 19 U/L (ref 15–41)
BILIRUBIN TOTAL: 0.8 mg/dL (ref 0.3–1.2)
Bilirubin, Direct: <0.1 mg/dL (ref 0.0–0.2)
TOTAL PROTEIN: 7.1 g/dL (ref 6.5–8.1)

## 2018-03-10 LAB — LIPID PANEL
Cholesterol: 147 mg/dL (ref 0–200)
HDL: 38 mg/dL — ABNORMAL LOW (ref >40)
LDL Cholesterol: 82 mg/dL (ref 0–99)
Total CHOL/HDL Ratio: 3.9 RATIO
Triglycerides: 137 mg/dL (ref <150)
VLDL: 27 mg/dL (ref 0–40)

## 2018-03-13 ENCOUNTER — Encounter: Payer: Self-pay | Admitting: Family Medicine

## 2018-03-14 ENCOUNTER — Telehealth: Payer: Self-pay | Admitting: Family Medicine

## 2018-03-14 ENCOUNTER — Other Ambulatory Visit: Payer: Self-pay | Admitting: *Deleted

## 2018-03-14 MED ORDER — METRONIDAZOLE 0.75 % EX CREA: TOPICAL_CREAM | CUTANEOUS | 11 refills | Status: DC

## 2018-03-14 MED ORDER — ESTRADIOL 0.1 MG/GM VA CREA: 1.0000 | TOPICAL_CREAM | VAGINAL | 11 refills | Status: DC

## 2018-03-14 NOTE — Telephone Encounter (Signed)
Fax from pharmacy requesting refill on Metrocream 0.75 and Estrace cream. Pt last seen 02/08/2018 for GAD. Please advise. Thank you

## 2018-03-14 NOTE — Telephone Encounter (Signed)
One yrs worth 

## 2018-03-14 NOTE — Telephone Encounter (Signed)
Refills sent to pharm

## 2018-03-15 ENCOUNTER — Encounter: Payer: Medicare Other | Admitting: Family Medicine

## 2018-03-16 NOTE — H&P (Signed)
PREOP CONSULT/REFERRAL INTRA-OFFICE FROM DR Tyrone Apple     Chief Complaint  Patient presents with  . Knee Pain      right knee discuss surgery       68 year old female started having pain in her knee last year and then on November 11 [employer] she was in [place] stood up and something popped in the back of her knee since that time she had progressively increasing medial knee pain which was relieved partially with injection however her knee is still uncomfortable she presents for evaluation and treatment after MRI showed torn medial meniscus, stress reaction medial tibial plateau and osteoarthritis of the patellofemoral joint in the medial compartment   Body of pain is a dull ache worse at night Severity moderate Associated with difficulty stair climbing and descent causing altered gait on stairs     Review of Systems  Constitutional: Negative for chills and fever.  HENT: Positive for congestion.   Respiratory: Negative for shortness of breath.   All other systems reviewed and are negative.           Past Medical History:  Diagnosis Date  . Anxiety    . Carpal tunnel syndrome of left wrist    . Hyperlipemia    . Seasonal allergies    . Tinnitus             Past Surgical History:  Procedure Laterality Date  . ABDOMINAL HYSTERECTOMY      . CESAREAN SECTION      . COLONOSCOPY N/A 11/13/2015    Procedure: COLONOSCOPY;  Surgeon: Rogene Houston, MD;  Location: [facility];  Service: Endoscopy;  Laterality: N/A;  730  . HAND SURGERY   08/2010    benign tumor removed Left thumb  . TONSILLECTOMY               Family History  Problem Relation Age of Onset  . Hypertension Mother    . Heart attack Mother    . Hyperlipidemia Mother      Social History        Tobacco Use  . Smoking status: Never Smoker  . Smokeless tobacco: Never Used  Substance Use Topics  . Alcohol use: No  . Drug use: No      No Known Allergies     Active Medications      Current  Meds  Medication Sig  . ALPRAZolam (XANAX) 0.25 MG tablet TAKE 1 TABLET BY MOUTH TWICE DAILY ( Per Pt she takes 1/2 in the afternoon.  Marland Kitchen atorvastatin (LIPITOR) 20 MG tablet TAKE 1 TABLET BY MOUTH EVERYDAY AT BEDTIME  . Biotin 300 MCG TABS Take by mouth. 600 mg daily.  . chlorzoxazone (PARAFON) 500 MG tablet TAKE 1/2 TABLET BY MOUTH UP TO 4 TIMES PER DAY  . cholecalciferol (VITAMIN D) 1000 UNITS tablet Take 1,000 Units by mouth daily.  Marland Kitchen escitalopram (LEXAPRO) 10 MG tablet TAKE 1 TABLET BY MOUTH EVERY DAY  . estradiol (ESTRACE) 0.1 MG/GM vaginal cream Place 1 Applicatorful vaginally once a week. Per pt she takes twice a week.  . famotidine (PEPCID) 20 MG tablet Take 20 mg by mouth 2 (two) times daily.  Marland Kitchen HYDROcodone-acetaminophen (NORCO/VICODIN) 5-325 MG tablet One tablet every four hours as needed for acute pain.  Limit of five days per [place].  . hydrocortisone 1 % lotion Apply BID to affected area. (Patient taking differently: Apply 1 application topically 2 (two) times daily as needed for itching. )  .  metroNIDAZOLE (METROCREAM) 0.75 % cream APPLY TOPICALLY 2 TIMES DAILY TO FACE  . Multiple Vitamin (MULITIVITAMIN WITH MINERALS) TABS Take 1 tablet by mouth daily. Women's One-A-Day  . Multiple Vitamins-Minerals (ICAPS) CAPS Take 1 capsule by mouth daily. Take on every day   . omega-3 acid ethyl esters (LOVAZA) 1 G capsule Take 1 g by mouth 3 (three) times daily.         BP 137/79   Pulse 87   Ht 5\' 1"  (1.549 m)   Wt 141 lb (64 kg)   BMI 26.64 kg/m    Physical Exam Vitals signs reviewed.  Constitutional:      Appearance: She is well-developed.  Musculoskeletal:     Right knee: She exhibits effusion.  Neurological:     Mental Status: She is alert and oriented to person, place, and time.     Gait: Gait is intact. Gait normal.  Psychiatric:        Attention and Perception: Attention normal.        Mood and Affect: Mood and affect normal.        Speech: Speech normal.         Behavior: Behavior normal.        Thought Content: Thought content normal.        Judgment: Judgment normal.        Right Knee Exam    Muscle Strength  The patient has normal right knee strength.   Tenderness  The patient is experiencing tenderness in the medial joint line.   Range of Motion  Extension: normal  Flexion: normal    Tests  McMurray:  Medial - positive Lateral - negative Varus: negative Valgus: negative Drawer:  Anterior - negative    Posterior - negative Patellar apprehension: negative   Other  Erythema: absent Scars: absent Sensation: normal Pulse: present Swelling: none Effusion: effusion present   Comments:  Normal range of motion right hip     Left Knee Exam    Muscle Strength  The patient has normal left knee strength.   Tenderness  The patient is experiencing no tenderness.    Range of Motion  Extension: normal  Flexion: normal    Tests  McMurray:  Medial - negative Lateral - negative Varus: negative Valgus: negative Drawer:  Anterior - negative     Posterior - negative Patellar apprehension: negative   Other  Erythema: absent Scars: absent Sensation: normal Pulse: present Swelling: none   Comments:  Normal range of motion left           MEDICAL DECISION SECTION  xrays ordered? mri   My independent reading of xrays: Torn medial meniscus chondral degeneration medial compartment patellofemoral compartment Baker's cyst ruptured   X-ray shows normal alignment to the right knee but she is trending towards varus.  No secondary bone changes seen on x-ray and she has a slight amount of narrowing on the medial side     MRI  IMPRESSION: Complete radial tear root of the posterior horn of the medial meniscus. The tear extends peripheral to the root in a horizontal orientation reaching the meniscal undersurface.   Marrow edema about the medial compartment consistent with stress change. No fracture.   Fluid about the MCL  consistent with grade 1 sprain.  No tear.   Mild to moderate osteoarthritis most notable in the patellofemoral and medial compartments.   Ruptured Baker's cyst with fluid dissecting along the gastrocnemius.     Electronically Signed   By: Marcello Moores  Dalessio M.D.   On: 01/04/2018 11:49               Encounter Diagnoses  Name Primary?  . Derangement of posterior horn of medial meniscus of right knee Yes  . Primary localized osteoarthritis of knee    . Stress reaction of bone          PLAN:    Surgical procedure planned: Arthroscopy right knee with torn medial meniscus stress reaction medial tibial plateau chondral changes of the patellofemoral joint and medial compartment with ruptured Baker's cyst   She was counseled that she will need a brace after surgery for 6 weeks to treat the stress reaction and she will still have arthritis and arthritic pain in the right knee.  She asked about possibility of knee replacement in the future and although this is not 100% guaranteed she will probably come to knee replacement in the future   She will be out of driving for 1 week   Therapy at home   The procedure has been fully reviewed with the patient; The risks and benefits of surgery have been discussed and explained and understood. Alternative treatment has also been reviewed, questions were encouraged and answered. The postoperative plan is also been reviewed.   Nonsurgical treatment as described in the history and physical section was attempted and unsuccessful and the patient has agreed to proceed with surgical intervention to improve their situation.   No orders of the defined types were placed in this encounter.     Arther Abbott, MD 01/31/2018 8:53 AM

## 2018-03-17 ENCOUNTER — Encounter (HOSPITAL_COMMUNITY): Payer: Self-pay | Admitting: *Deleted

## 2018-03-17 ENCOUNTER — Ambulatory Visit (HOSPITAL_COMMUNITY)
Admission: RE | Admit: 2018-03-17 | Discharge: 2018-03-17 | Disposition: A | Discharge status: Home / Self Care | Payer: Medicare Other | Attending: Orthopedic Surgery | Admitting: Orthopedic Surgery

## 2018-03-17 ENCOUNTER — Ambulatory Visit (HOSPITAL_COMMUNITY): Payer: Medicare Other | Admitting: Anesthesiology

## 2018-03-17 ENCOUNTER — Encounter (HOSPITAL_COMMUNITY)
Admission: RE | Disposition: A | Discharge status: Home / Self Care | Payer: Self-pay | Source: Home / Self Care | Attending: Orthopedic Surgery

## 2018-03-17 DIAGNOSIS — S83241A Other tear of medial meniscus, current injury, right knee, initial encounter: Secondary | ICD-10-CM | POA: Insufficient documentation

## 2018-03-17 DIAGNOSIS — M1711 Unilateral primary osteoarthritis, right knee: Secondary | ICD-10-CM | POA: Insufficient documentation

## 2018-03-17 DIAGNOSIS — M2241 Chondromalacia patellae, right knee: Secondary | ICD-10-CM | POA: Diagnosis not present

## 2018-03-17 DIAGNOSIS — X58XXXA Exposure to other specified factors, initial encounter: Secondary | ICD-10-CM | POA: Diagnosis not present

## 2018-03-17 DIAGNOSIS — E785 Hyperlipidemia, unspecified: Secondary | ICD-10-CM | POA: Insufficient documentation

## 2018-03-17 DIAGNOSIS — K219 Gastro-esophageal reflux disease without esophagitis: Secondary | ICD-10-CM | POA: Insufficient documentation

## 2018-03-17 DIAGNOSIS — S83241D Other tear of medial meniscus, current injury, right knee, subsequent encounter: Secondary | ICD-10-CM

## 2018-03-17 DIAGNOSIS — Z9889 Other specified postprocedural states: Secondary | ICD-10-CM

## 2018-03-17 DIAGNOSIS — Z79890 Hormone replacement therapy (postmenopausal): Secondary | ICD-10-CM | POA: Diagnosis not present

## 2018-03-17 DIAGNOSIS — Z79899 Other long term (current) drug therapy: Secondary | ICD-10-CM | POA: Insufficient documentation

## 2018-03-17 DIAGNOSIS — M1990 Unspecified osteoarthritis, unspecified site: Secondary | ICD-10-CM | POA: Diagnosis not present

## 2018-03-17 DIAGNOSIS — Z8249 Family history of ischemic heart disease and other diseases of the circulatory system: Secondary | ICD-10-CM | POA: Diagnosis not present

## 2018-03-17 DIAGNOSIS — F419 Anxiety disorder, unspecified: Secondary | ICD-10-CM | POA: Diagnosis not present

## 2018-03-17 HISTORY — PX: KNEE ARTHROSCOPY WITH MEDIAL MENISECTOMY: SHX5651

## 2018-03-17 SURGERY — ARTHROSCOPY, KNEE, WITH MEDIAL MENISCECTOMY
Anesthesia: General | Site: Knee | Laterality: Right

## 2018-03-17 MED ORDER — LIDOCAINE HCL 1 % IJ SOLN
INTRAMUSCULAR | Status: DC | PRN
  Administered 2018-03-17: 50 mg via INTRADERMAL

## 2018-03-17 MED ORDER — MIDAZOLAM HCL 2 MG/2ML IJ SOLN
INTRAMUSCULAR | Status: AC
  Filled 2018-03-17: qty 2

## 2018-03-17 MED ORDER — 0.9 % SODIUM CHLORIDE (POUR BTL) OPTIME
TOPICAL | Status: DC | PRN
  Administered 2018-03-17: 1000 mL

## 2018-03-17 MED ORDER — PROPOFOL 10 MG/ML IV BOLUS
INTRAVENOUS | Status: AC
  Filled 2018-03-17: qty 40

## 2018-03-17 MED ORDER — FENTANYL CITRATE (PF) 100 MCG/2ML IJ SOLN
INTRAMUSCULAR | Status: AC
  Filled 2018-03-17: qty 4

## 2018-03-17 MED ORDER — SUGAMMADEX SODIUM 200 MG/2ML IV SOLN
INTRAVENOUS | Status: AC
  Filled 2018-03-17: qty 2

## 2018-03-17 MED ORDER — ROCURONIUM BROMIDE 100 MG/10ML IV SOLN
INTRAVENOUS | Status: DC | PRN
  Administered 2018-03-17: 20 mg via INTRAVENOUS
  Administered 2018-03-17: 10 mg via INTRAVENOUS

## 2018-03-17 MED ORDER — ONDANSETRON HCL 4 MG/2ML IJ SOLN
4.0000 mg | Freq: Once | INTRAMUSCULAR | Status: AC
  Administered 2018-03-17: 4 mg via INTRAVENOUS

## 2018-03-17 MED ORDER — BUPIVACAINE-EPINEPHRINE (PF) 0.5% -1:200000 IJ SOLN
INTRAMUSCULAR | Status: AC
  Filled 2018-03-17: qty 60

## 2018-03-17 MED ORDER — FENTANYL CITRATE (PF) 100 MCG/2ML IJ SOLN
INTRAMUSCULAR | Status: DC | PRN
  Administered 2018-03-17 (×3): 50 ug via INTRAVENOUS

## 2018-03-17 MED ORDER — CEFAZOLIN SODIUM-DEXTROSE 2-4 GM/100ML-% IV SOLN
2.0000 g | INTRAVENOUS | Status: AC
  Administered 2018-03-17: 2 g via INTRAVENOUS
  Filled 2018-03-17: qty 100

## 2018-03-17 MED ORDER — LACTATED RINGERS IV SOLN
INTRAVENOUS | Status: DC
  Administered 2018-03-17: 1000 mL via INTRAVENOUS

## 2018-03-17 MED ORDER — ONDANSETRON HCL 4 MG/2ML IJ SOLN
INTRAMUSCULAR | Status: AC
  Filled 2018-03-17: qty 2

## 2018-03-17 MED ORDER — PROPOFOL 10 MG/ML IV BOLUS
INTRAVENOUS | Status: DC | PRN
  Administered 2018-03-17: 40 mg via INTRAVENOUS
  Administered 2018-03-17: 160 mg via INTRAVENOUS

## 2018-03-17 MED ORDER — MIDAZOLAM HCL 5 MG/5ML IJ SOLN
INTRAMUSCULAR | Status: DC | PRN
  Administered 2018-03-17: 2 mg via INTRAVENOUS

## 2018-03-17 MED ORDER — SODIUM CHLORIDE 0.9 % IR SOLN
Status: DC | PRN
  Administered 2018-03-17 (×3): 3000 mL

## 2018-03-17 MED ORDER — EPHEDRINE SULFATE 50 MG/ML IJ SOLN
INTRAMUSCULAR | Status: DC | PRN
  Administered 2018-03-17: 10 mg via INTRAVENOUS

## 2018-03-17 MED ORDER — EPHEDRINE 5 MG/ML INJ
INTRAVENOUS | Status: AC
  Filled 2018-03-17: qty 10

## 2018-03-17 MED ORDER — CHLORHEXIDINE GLUCONATE 4 % EX LIQD: 60.0000 mL | Freq: Once | CUTANEOUS | Status: DC

## 2018-03-17 MED ORDER — SUCCINYLCHOLINE CHLORIDE 20 MG/ML IJ SOLN
INTRAMUSCULAR | Status: DC | PRN
  Administered 2018-03-17: 160 mg via INTRAVENOUS

## 2018-03-17 MED ORDER — LIDOCAINE 2% (20 MG/ML) 5 ML SYRINGE
INTRAMUSCULAR | Status: AC
  Filled 2018-03-17: qty 5

## 2018-03-17 MED ORDER — ROCURONIUM BROMIDE 10 MG/ML (PF) SYRINGE
PREFILLED_SYRINGE | INTRAVENOUS | Status: AC
  Filled 2018-03-17: qty 10

## 2018-03-17 MED ORDER — BUPIVACAINE-EPINEPHRINE (PF) 0.5% -1:200000 IJ SOLN
INTRAMUSCULAR | Status: DC | PRN
  Administered 2018-03-17: 60 mL via PERINEURAL

## 2018-03-17 MED ORDER — SUGAMMADEX SODIUM 200 MG/2ML IV SOLN
INTRAVENOUS | Status: DC | PRN
  Administered 2018-03-17: 100 mg via INTRAVENOUS

## 2018-03-17 MED ORDER — PROMETHAZINE HCL 12.5 MG PO TABS: 12.5000 mg | ORAL_TABLET | Freq: Four times a day (QID) | ORAL | 0 refills | Status: DC | PRN

## 2018-03-17 MED ORDER — ACETAMINOPHEN-CODEINE #3 300-30 MG PO TABS: 1.0000 | ORAL_TABLET | Freq: Four times a day (QID) | ORAL | 0 refills | Status: DC | PRN

## 2018-03-17 MED ORDER — EPINEPHRINE PF 1 MG/ML IJ SOLN
INTRAMUSCULAR | Status: AC
  Filled 2018-03-17: qty 3

## 2018-03-17 MED ORDER — SUCCINYLCHOLINE CHLORIDE 200 MG/10ML IV SOSY
PREFILLED_SYRINGE | INTRAVENOUS | Status: AC
  Filled 2018-03-17: qty 10

## 2018-03-17 SURGICAL SUPPLY — 52 items
ARTHROWAND PARAGON T2 (SURGICAL WAND)
BANDAGE ELASTIC 6 LF NS (GAUZE/BANDAGES/DRESSINGS) ×2 IMPLANT
BLADE AGGRESSIVE PLUS 4.0 (BLADE) ×2 IMPLANT
BLADE SURG SZ11 CARB STEEL (BLADE) ×2 IMPLANT
CHLORAPREP W/TINT 26ML (MISCELLANEOUS) ×2 IMPLANT
CLOTH BEACON ORANGE TIMEOUT ST (SAFETY) ×2 IMPLANT
COOLER CRYO IC GRAV AND TUBE (ORTHOPEDIC SUPPLIES) ×2 IMPLANT
COVER WAND RF STERILE (DRAPES) ×2 IMPLANT
CUFF CRYO KNEE18X23 MED (MISCELLANEOUS) ×2 IMPLANT
CUFF TOURNIQUET SINGLE 34IN LL (TOURNIQUET CUFF) ×2 IMPLANT
CUTTER ANGLED AGGR PLUS 4.0 (BURR) ×2 IMPLANT
CUTTER ANGLED DBL BITE 4.5 (BURR) IMPLANT
DECANTER SPIKE VIAL GLASS SM (MISCELLANEOUS) ×4 IMPLANT
GAUZE 4X4 16PLY RFD (DISPOSABLE) ×2 IMPLANT
GAUZE SPONGE 4X4 12PLY STRL (GAUZE/BANDAGES/DRESSINGS) ×2 IMPLANT
GAUZE SPONGE 4X4 16PLY XRAY LF (GAUZE/BANDAGES/DRESSINGS) ×2 IMPLANT
GAUZE XEROFORM 5X9 LF (GAUZE/BANDAGES/DRESSINGS) ×2 IMPLANT
GLOVE BIOGEL PI IND STRL 7.0 (GLOVE) ×2 IMPLANT
GLOVE BIOGEL PI INDICATOR 7.0 (GLOVE) ×2
GLOVE ECLIPSE 6.5 STRL STRAW (GLOVE) ×2 IMPLANT
GLOVE SKINSENSE NS SZ8.0 LF (GLOVE) ×1
GLOVE SKINSENSE STRL SZ8.0 LF (GLOVE) ×1 IMPLANT
GLOVE SS N UNI LF 8.5 STRL (GLOVE) ×2 IMPLANT
GOWN STRL REUS W/ TWL LRG LVL3 (GOWN DISPOSABLE) ×1 IMPLANT
GOWN STRL REUS W/TWL LRG LVL3 (GOWN DISPOSABLE) ×1
GOWN STRL REUS W/TWL XL LVL3 (GOWN DISPOSABLE) ×2 IMPLANT
HLDR LEG FOAM (MISCELLANEOUS) ×1 IMPLANT
IV NS IRRIG 3000ML ARTHROMATIC (IV SOLUTION) ×6 IMPLANT
KIT BLADEGUARD II DBL (SET/KITS/TRAYS/PACK) ×2 IMPLANT
KIT TURNOVER CYSTO (KITS) ×2 IMPLANT
LEG HOLDER FOAM (MISCELLANEOUS) ×1
MANIFOLD NEPTUNE II (INSTRUMENTS) ×2 IMPLANT
MARKER SKIN DUAL TIP RULER LAB (MISCELLANEOUS) ×2 IMPLANT
NEEDLE HYPO 18GX1.5 BLUNT FILL (NEEDLE) ×2 IMPLANT
NEEDLE HYPO 21X1.5 SAFETY (NEEDLE) ×2 IMPLANT
NEEDLE SPNL 18GX3.5 QUINCKE PK (NEEDLE) ×2 IMPLANT
NS IRRIG 1000ML POUR BTL (IV SOLUTION) ×2 IMPLANT
PACK ARTHRO LIMB DRAPE STRL (MISCELLANEOUS) ×2 IMPLANT
PAD ABD 5X9 TENDERSORB (GAUZE/BANDAGES/DRESSINGS) ×2 IMPLANT
PAD ARMBOARD 7.5X6 YLW CONV (MISCELLANEOUS) ×2 IMPLANT
PADDING CAST COTTON 6X4 STRL (CAST SUPPLIES) ×2 IMPLANT
PADDING WEBRIL 6 STERILE (GAUZE/BANDAGES/DRESSINGS) ×2 IMPLANT
PROBE BIPOLAR 50 DEGREE SUCT (MISCELLANEOUS) ×2 IMPLANT
PROBE BIPOLAR ATHRO 135MM 90D (MISCELLANEOUS) IMPLANT
SET ARTHROSCOPY INST (INSTRUMENTS) ×2 IMPLANT
SET BASIN LINEN APH (SET/KITS/TRAYS/PACK) ×2 IMPLANT
SUT ETHILON 3 0 FSL (SUTURE) ×2 IMPLANT
SYR 10ML LL (SYRINGE) ×2 IMPLANT
SYR 30ML LL (SYRINGE) ×2 IMPLANT
TUBE CONNECTING 12X1/4 (SUCTIONS) ×4 IMPLANT
TUBING ARTHRO INFLOW-ONLY STRL (TUBING) ×2 IMPLANT
WAND ARTHRO PARAGON T2 (SURGICAL WAND) IMPLANT

## 2018-03-17 NOTE — Anesthesia Procedure Notes (Signed)
Procedure Name: Intubation Date/Time: 03/17/2018 7:35 AM Performed by: Charmaine Downs, CRNA Pre-anesthesia Checklist: Patient identified, Emergency Drugs available, Patient being monitored and Suction available Patient Re-evaluated:Patient Re-evaluated prior to induction Oxygen Delivery Method: Circle system utilized Preoxygenation: Pre-oxygenation with 100% oxygen Induction Type: IV induction, Rapid sequence and Cricoid Pressure applied Laryngoscope Size: Mac and 3 Grade View: Grade II Tube size: 7.0 mm Number of attempts: 1 Airway Equipment and Method: Stylet Placement Confirmation: ETT inserted through vocal cords under direct vision,  positive ETCO2 and breath sounds checked- equal and bilateral Secured at: 22 cm Tube secured with: Tape Dental Injury: Teeth and Oropharynx as per pre-operative assessment

## 2018-03-17 NOTE — Op Note (Signed)
03/17/2018  8:17 AM  PATIENT:  Connie Colon  68 y.o. female  PRE-OPERATIVE DIAGNOSIS:  torn medial meniscus right knee  POST-OPERATIVE DIAGNOSIS:  torn medial meniscus right knee  PROCEDURE:  Procedure(s): KNEE ARTHROSCOPY WITH MEDIAL MENISECTOMY (Right) 29881  Surgical findings grade IV chondromalacia of the patella medial facet  Posterior horn root tear medial meniscus   SURGEON:  Surgeon(s) and Role:    Carole Civil, MD - Primary  Knee arthroscopy dictation  The patient was identified in the preoperative holding area using 2 approved identification mechanisms. The chart was reviewed and updated. The surgical site was confirmed as right knee and marked with an indelible marker.  The patient was taken to the operating room for anesthesia. After successful general anesthesia, Ancef 2 g was used as IV antibiotics.  The patient was placed in the supine position with the (right) the operative extremity in an arthroscopic leg holder and the opposite extremity in a padded leg holder.  The timeout was executed.  A lateral portal was established with an 11 blade and the scope was introduced into the joint. A diagnostic arthroscopy was performed in circumferential manner examining the entire knee joint. A medial portal was established and the diagnostic arthroscopy was repeated using a probe to palpate intra-articular structures as they were encountered.     The medial meniscus was resected using a duckbill forceps. The meniscal fragments were removed with a motorized shaver. The meniscus was balanced with a combination of a motorized shaver and a 50 ArthroCare wand until a stable rim was obtained.   The arthroscopic pump was placed on the wash mode and any excess debris was removed from the joint using suction.  60 cc of Marcaine with epinephrine was injected through the arthroscope.  The portals were closed with 3-0 nylon suture.  A sterile bandage, Ace wrap and  Cryo/Cuff was placed and the Cryo/Cuff was activated. The patient was taken to the recovery room in stable condition.   There were no assistance   Anesthesia was general   Blood loss 0   None blood administered   No drains   60 cc Marcaine half percent with epinephrine injected post procedure   No specimens PHYSICIAN ASSISTANT:   ASSISTANTS: none   DISPOSITION OF SPECIMEN:  N/A  COUNTS:  YES  TOURNIQUET:  * Missing tourniquet times found for documented tourniquets in log: [id number] *  DICTATION: .Viviann Spare Dictation  PLAN OF CARE: Discharge to home after PACU  PATIENT DISPOSITION:  PACU - hemodynamically stable.   Delay start of Pharmacological VTE agent (>24hrs) due to surgical blood loss or risk of bleeding: not applicable

## 2018-03-17 NOTE — Anesthesia Preprocedure Evaluation (Signed)
Anesthesia Evaluation  Patient identified by MRN, date of birth, ID band Patient awake    Reviewed: Allergy & Precautions, NPO status , Patient's Chart, lab work & pertinent test results  Airway Mallampati: II  TM Distance: >3 FB Neck ROM: Full    Dental no notable dental hx. (+) Teeth Intact   Pulmonary neg pulmonary ROS,    Pulmonary exam normal breath sounds clear to auscultation       Cardiovascular Exercise Tolerance: Good negative cardio ROS Normal cardiovascular examI Rhythm:Regular Rate:Normal     Neuro/Psych Anxiety  Neuromuscular disease negative psych ROS   GI/Hepatic Neg liver ROS, GERD  Medicated and Controlled,Vomited today   Endo/Other  negative endocrine ROS  Renal/GU negative Renal ROS  negative genitourinary   Musculoskeletal  (+) Arthritis , Osteoarthritis,    Abdominal   Peds negative pediatric ROS (+)  Hematology negative hematology ROS (+)   Anesthesia Other Findings   Reproductive/Obstetrics negative OB ROS                             Anesthesia Physical Anesthesia Plan  ASA: II  Anesthesia Plan: General   Post-op Pain Management:    Induction: Intravenous  PONV Risk Score and Plan:   Airway Management Planned: Oral ETT  Additional Equipment:   Intra-op Plan:   Post-operative Plan: Extubation in OR  Informed Consent: I have reviewed the patients History and Physical, chart, labs and discussed the procedure including the risks, benefits and alternatives for the proposed anesthesia with the patient or authorized representative who has indicated his/her understanding and acceptance.     Dental advisory given  Plan Discussed with: CRNA  Anesthesia Plan Comments:         Anesthesia Quick Evaluation

## 2018-03-17 NOTE — Anesthesia Procedure Notes (Signed)
Performed by: Aengus Sauceda J, CRNA       

## 2018-03-17 NOTE — Anesthesia Postprocedure Evaluation (Signed)
Anesthesia Post Note  Patient: Connie Colon  Procedure(s) Performed: KNEE ARTHROSCOPY WITH MEDIAL MENISECTOMY (Right Knee)  Patient location during evaluation: PACU Anesthesia Type: General Level of consciousness: awake and patient cooperative Pain management: pain level controlled Vital Signs Assessment: post-procedure vital signs reviewed and stable Respiratory status: spontaneous breathing, nonlabored ventilation and respiratory function stable Cardiovascular status: blood pressure returned to baseline Postop Assessment: no apparent nausea or vomiting Anesthetic complications: no     Last Vitals:  Vitals:   03/17/18 0652 03/17/18 0835  BP: (!) 170/87   Pulse:  89  Resp: 17 20  Temp: 36.8 C 36.9 C  SpO2: 98%     Last Pain:  Vitals:   03/17/18 0835  TempSrc:   PainSc: 0-No pain                 Mckinley Adelstein J

## 2018-03-17 NOTE — Interval H&P Note (Signed)
History and Physical Interval Note:  03/17/2018 7:11 AM  Connie Colon  has presented today for surgery, with the diagnosis of torn medial meniscus right knee  The various methods of treatment have been discussed with the patient and family. After consideration of risks, benefits and other options for treatment, the patient has consented to  Procedure(s): KNEE ARTHROSCOPY WITH MEDIAL MENISECTOMY (Right) as a surgical intervention .  The patient's history has been reviewed, patient examined, no change in status, stable for surgery.  I have reviewed the patient's chart and labs.  Questions were answered to the patient's satisfaction.     Arther Abbott

## 2018-03-17 NOTE — Brief Op Note (Signed)
03/17/2018  8:17 AM  PATIENT:  Connie Colon  68 y.o. female  PRE-OPERATIVE DIAGNOSIS:  torn medial meniscus right knee  POST-OPERATIVE DIAGNOSIS:  torn medial meniscus right knee  PROCEDURE:  Procedure(s): KNEE ARTHROSCOPY WITH MEDIAL MENISECTOMY (Right)   Surgical findings grade IV chondromalacia of the patella medial facet  Posterior horn root tear medial meniscus   SURGEON:  Surgeon(s) and Role:    Carole Civil, MD - Primary  There were no assistance   Anesthesia was general   Blood loss 0   None blood administered   No drains   60 cc Marcaine half percent with epinephrine injected post procedure   No specimens PHYSICIAN ASSISTANT:   ASSISTANTS: none   DISPOSITION OF SPECIMEN:  N/A  COUNTS:  YES  TOURNIQUET:  * Missing tourniquet times found for documented tourniquets in log: [id number] *  DICTATION: .Viviann Spare Dictation  PLAN OF CARE: Discharge to home after PACU  PATIENT DISPOSITION:  PACU - hemodynamically stable.   Delay start of Pharmacological VTE agent (>24hrs) due to surgical blood loss or risk of bleeding: not applicable

## 2018-03-17 NOTE — Discharge Instructions (Signed)
Your surgery was successful in removing the torn piece of meniscus from your knee  He also had mild arthritis in the knee especially behind the kneecap  General Anesthesia, Adult, Care After This sheet gives you information about how to care for yourself after your procedure. Your health care provider may also give you more specific instructions. If you have problems or questions, contact your health care provider. What can I expect after the procedure? After the procedure, the following side effects are common:  Pain or discomfort at the IV site.  Nausea.  Vomiting.  Sore throat.  Trouble concentrating.  Feeling cold or chills.  Weak or tired.  Sleepiness and fatigue.  Soreness and body aches. These side effects can affect parts of the body that were not involved in surgery. Follow these instructions at home:  For at least 24 hours after the procedure:  Have a responsible adult stay with you. It is important to have someone help care for you until you are awake and alert.  Rest as needed.  Do not: ? Participate in activities in which you could fall or become injured. ? Drive. ? Use heavy machinery. ? Drink alcohol. ? Take sleeping pills or medicines that cause drowsiness. ? Make important decisions or sign legal documents. ? Take care of children on your own. Eating and drinking  Follow any instructions from your health care provider about eating or drinking restrictions.  When you feel hungry, start by eating small amounts of foods that are soft and easy to digest (bland), such as toast. Gradually return to your regular diet.  Drink enough fluid to keep your urine pale yellow.  If you vomit, rehydrate by drinking water, juice, or clear broth. General instructions  If you have sleep apnea, surgery and certain medicines can increase your risk for breathing problems. Follow instructions from your health care provider about wearing your sleep device: ? Anytime  you are sleeping, including during daytime naps. ? While taking prescription pain medicines, sleeping medicines, or medicines that make you drowsy.  Return to your normal activities as told by your health care provider. Ask your health care provider what activities are safe for you.  Take over-the-counter and prescription medicines only as told by your health care provider.  If you smoke, do not smoke without supervision.  Keep all follow-up visits as told by your health care provider. This is important. Contact a health care provider if:  You have nausea or vomiting that does not get better with medicine.  You cannot eat or drink without vomiting.  You have pain that does not get better with medicine.  You are unable to pass urine.  You develop a skin rash.  You have a fever.  You have redness around your IV site that gets worse. Get help right away if:  You have difficulty breathing.  You have chest pain.  You have blood in your urine or stool, or you vomit blood. Summary  After the procedure, it is common to have a sore throat or nausea. It is also common to feel tired.  Have a responsible adult stay with you for the first 24 hours after general anesthesia. It is important to have someone help care for you until you are awake and alert.  When you feel hungry, start by eating small amounts of foods that are soft and easy to digest (bland), such as toast. Gradually return to your regular diet.  Drink enough fluid to keep your urine pale yellow.  Return to your normal activities as told by your health care provider. Ask your health care provider what activities are safe for you. This information is not intended to replace advice given to you by your health care provider. Make sure you discuss any questions you have with your health care provider. Document Released: 04/20/2000 Document Revised: 08/28/2016 Document Reviewed: 08/28/2016 Elsevier Interactive Patient Education   2019 [employer].   Knee Arthroscopy, Care After This sheet gives you information about how to care for yourself after your procedure. Your health care provider may also give you more specific instructions. If you have problems or questions, contact your health care provider. What can I expect after the procedure? After the procedure, it is common to have:  Soreness.  Swelling.  Pain that can be relieved by taking pain medicine. Follow these instructions at home: Incision care   Follow instructions from your health care provider about how to take care of your incisions. Make sure you: ? Wash your hands with soap and water before you change your bandage (dressing). If soap and water are not available, use hand sanitizer. ? Change your dressing as told by your health care provider. ? Leave stitches (sutures), staples, skin glue, or adhesive strips in place. These skin closures may need to stay in place for 2 weeks or longer. If adhesive strip edges start to loosen and curl up, you may trim the loose edges. Do not remove adhesive strips completely unless your health care provider tells you to do that.  Check your incision areas every day for signs of infection. Check for: ? Redness. ? More swelling or pain. ? Fluid or blood. ? Warmth. ? Pus or a bad smell. Bathing  Do not take baths, swim, or use a hot tub until your health care provider approves. Ask your health care provider if you may take showers. You may only be allowed to take sponge baths. Activity  Do not use your knee to support your body weight until your health care provider says that you can. Follow weight-bearing restrictions as told. Use crutches or other devices to help you move around (assistive devices) as directed.  Ask your health care provider what activities are safe for you during recovery, and what activities you need to avoid.  If physical therapy was prescribed, do exercises as directed. Doing exercises  may help improve knee movement and flexibility (range of motion).  Do not lift anything that is heavier than 10 lb (4.5 kg), or the limit that you are told, until your health care provider says that it is safe. Driving  Do not drive until your health care provider approves. You may be able to drive after 1-3 weeks.  Do not drive or use heavy machinery while taking prescription pain medicine. Managing pain, stiffness, and swelling   If directed, put ice on the injured area: ? Put ice in a plastic bag or use the icing device (cold therapy unit) that you were given. Follow instructions from your health care provider about how to use the icing device. ? Place a towel between your skin and the bag or between your skin and the icing device. ? Leave the ice on for 20 minutes, 2-3 times a day.  Move your toes often to avoid stiffness and to lessen swelling.  Raise (elevate) the injured area above the level of your heart while you are sitting or lying down. If you are taking blood thinners:  Before you take any medicines that contain  aspirin or NSAIDs, talk with your health care provider. These medicines increase your risk for dangerous bleeding.  Take your medicine exactly as told, at the same time every day.  Avoid activities that could cause injury or bruising, and follow instructions about how to prevent falls.  Wear a medical alert bracelet or carry a card that lists what medicines you take. General instructions  Take over-the-counter and prescription medicines only as told by your health care provider.  If you are taking prescription pain medicine, take actions to prevent or treat constipation. Your health care provider may recommend that you: ? Drink enough fluid to keep your urine pale yellow. ? Eat foods that are high in fiber, such as fresh fruits and vegetables, whole grains, and beans. ? Limit foods that are high in fat and processed sugars, such as fried or sweet foods. ? Take  an over-the-counter or prescription medicines for constipation.  Do not use any products that contain nicotine or tobacco, such as cigarettes and e-cigarettes. These can delay incision or bone healing. If you need help quitting, ask your health care provider.  Wear compression stockings as told by your health care provider. These stockings help to prevent blood clots and reduce swelling in your legs.  Keep all follow-up visits as told by your health care provider. This is important. Contact a health care provider if you:  Have a fever.  Have severe pain.  Have redness around an incision.  Have more swelling.  Have fluid or blood coming from an incision.  Notice that an incision feels warm to the touch.  Notice pus or a bad smell coming from an incision.  Notice that an incision opens up.  Develop a rash. Get help right away if you:  Have difficulty breathing.  Have shortness of breath.  Have chest pain.  Develop pain in your lower leg or at the back of your knee.  Have numbness or tingling in your lower leg or your foot. Summary  Raise (elevate) the injured area above the level of your heart while you are sitting or lying down.  To help relieve pain and swelling, put ice on your leg for 20 minutes at a time, 2-3 times a day.  If you were prescribed a blood thinner, avoid activities that could cause injury or bruising, and follow instructions about how to prevent falls.  If physical therapy was prescribed, do exercises as directed. Doing exercises may help improve range of motion. This information is not intended to replace advice given to you by your health care provider. Make sure you discuss any questions you have with your health care provider. Document Released: 08/01/2004 Document Revised: 11/25/2016 Document Reviewed: 11/25/2016 Elsevier Interactive Patient Education  2019 [employer].

## 2018-03-17 NOTE — Transfer of Care (Signed)
Immediate Anesthesia Transfer of Care Note  Patient: Connie Colon  Procedure(s) Performed: KNEE ARTHROSCOPY WITH MEDIAL MENISECTOMY (Right Knee)  Patient Location: PACU  Anesthesia Type:General  Level of Consciousness: drowsy and patient cooperative  Airway & Oxygen Therapy: Patient Spontanous Breathing and Patient connected to face mask oxygen  Post-op Assessment: Report given to RN, Post -op Vital signs reviewed and stable and Patient moving all extremities  Post vital signs: Reviewed and stable  Last Vitals:  Vitals Value Taken Time  BP    Temp    Pulse 102 03/17/2018  8:34 AM  Resp 24 03/17/2018  8:34 AM  SpO2 84 % 03/17/2018  8:34 AM  Vitals shown include unvalidated device data.  Last Pain:  Vitals:   03/17/18 0652  TempSrc: Oral  PainSc: 0-No pain      Patients Stated Pain Goal: 7 (38/46/65 9935)  Complications: No apparent anesthesia complications

## 2018-03-18 ENCOUNTER — Encounter (HOSPITAL_COMMUNITY): Payer: Self-pay | Admitting: Orthopedic Surgery

## 2018-03-25 ENCOUNTER — Encounter: Payer: Self-pay | Admitting: Orthopedic Surgery

## 2018-03-25 ENCOUNTER — Ambulatory Visit (INDEPENDENT_AMBULATORY_CARE_PROVIDER_SITE_OTHER): Payer: Medicare Other | Admitting: Orthopedic Surgery

## 2018-03-25 VITALS — BP 126/61 | HR 73 | Ht 61.0 in | Wt 141.0 lb

## 2018-03-25 DIAGNOSIS — Z9889 Other specified postprocedural states: Secondary | ICD-10-CM

## 2018-03-25 NOTE — Progress Notes (Signed)
Patient ID: Connie Colon, female   DOB: 1951-01-15, 68 y.o.   MRN: [id number]  Chief Complaint  Patient presents with  . Routine Post Op    Rt knee DOS 03/17/18    The patient is status post knee arthroscopy as described. They're doing well without any major complaints. Pain is well controlled. Incisions are clean, we removed the sutures  She complained of some calf pain after she is walked for long time.  Her calf is soft and supple negative Homans sign no leg swelling    The patient will start physical therapy  Follow-up 3 weeks  Operative report  03/17/2018  8:17 AM  PATIENT:  Connie Colon  69 y.o. 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

## 2018-03-25 NOTE — Patient Instructions (Signed)
Continue ice application 4 times a day  Start exercises daily as instructed on the knee program  Try to use as little pain medication as possible, use Tylenol or Advil or Aleve and ice to control pain

## 2018-04-22 ENCOUNTER — Other Ambulatory Visit: Payer: Self-pay

## 2018-04-22 ENCOUNTER — Encounter: Payer: Self-pay | Admitting: Orthopedic Surgery

## 2018-04-22 ENCOUNTER — Ambulatory Visit (INDEPENDENT_AMBULATORY_CARE_PROVIDER_SITE_OTHER): Payer: Medicare Other | Admitting: Orthopedic Surgery

## 2018-04-22 VITALS — BP 130/77 | HR 79 | Temp 97.2°F | Ht 61.0 in | Wt 141.0 lb

## 2018-04-22 DIAGNOSIS — Z9889 Other specified postprocedural states: Secondary | ICD-10-CM

## 2018-04-22 NOTE — Patient Instructions (Signed)
You may resume normal activities gradually  Call us if there are any problems

## 2018-04-22 NOTE — Progress Notes (Signed)
Postop visit status post arthroscopy of the right knee  She has some intermittent mild discomfort seems to be worse at night but seems to be getting better  Knee examination showed full range of motion excellent quadriceps function normal gait pattern  Recommend follow-up as needed  Encounter Diagnosis  Name Primary?  . S/P right knee arthroscopy 03/17/2018 medial meniscectomy Yes

## 2018-08-11 ENCOUNTER — Telehealth: Payer: Self-pay | Admitting: Family Medicine

## 2018-08-11 MED ORDER — PANTOPRAZOLE SODIUM 40 MG PO TBEC: 40.0000 mg | DELAYED_RELEASE_TABLET | Freq: Every day | ORAL | 11 refills | Status: DC

## 2018-08-11 NOTE — Telephone Encounter (Signed)
protonix 40 qam one yrs worth

## 2018-08-11 NOTE — Telephone Encounter (Signed)
Prescription sent electronically to pharmacy. Patient notified. 

## 2018-08-11 NOTE — Telephone Encounter (Signed)
Patient is requesting something for reflux.She states its worst at night.She has been taking something over the counter but no helping. [facility]

## 2018-08-23 ENCOUNTER — Other Ambulatory Visit: Payer: Self-pay | Admitting: Family Medicine

## 2018-08-23 NOTE — Telephone Encounter (Signed)
sched  Visit virt or otherowise, then may ref times one

## 2018-08-24 NOTE — Telephone Encounter (Signed)
Left message to schedule office.

## 2018-08-29 ENCOUNTER — Other Ambulatory Visit: Payer: Self-pay

## 2018-08-31 NOTE — Telephone Encounter (Signed)
Video appt scheduled for 09/08/2018 - pt aware we will send in refill

## 2018-09-08 ENCOUNTER — Ambulatory Visit (INDEPENDENT_AMBULATORY_CARE_PROVIDER_SITE_OTHER): Payer: Medicare Other | Admitting: Family Medicine

## 2018-09-08 ENCOUNTER — Other Ambulatory Visit: Payer: Self-pay

## 2018-09-08 DIAGNOSIS — I10 Essential (primary) hypertension: Secondary | ICD-10-CM

## 2018-09-08 DIAGNOSIS — E785 Hyperlipidemia, unspecified: Secondary | ICD-10-CM

## 2018-09-08 DIAGNOSIS — G250 Essential tremor: Secondary | ICD-10-CM | POA: Diagnosis not present

## 2018-09-08 DIAGNOSIS — F411 Generalized anxiety disorder: Secondary | ICD-10-CM

## 2018-09-08 MED ORDER — ATORVASTATIN CALCIUM 20 MG PO TABS: ORAL_TABLET | ORAL | 1 refills | Status: DC

## 2018-09-08 MED ORDER — PANTOPRAZOLE SODIUM 40 MG PO TBEC: 40.0000 mg | DELAYED_RELEASE_TABLET | Freq: Every day | ORAL | 5 refills | Status: DC

## 2018-09-08 MED ORDER — ESCITALOPRAM OXALATE 10 MG PO TABS: 10.0000 mg | ORAL_TABLET | Freq: Every day | ORAL | 1 refills | Status: DC

## 2018-09-08 NOTE — Progress Notes (Signed)
   Subjective:  Audio only   Patient ID: Connie Colon, female    DOB: 06/06/1950, 68 y.o.   MRN: [id number]  Hyperlipidemia This is a chronic problem. Treatments tried: atorvastatin 20mg . Compliance problems: eats healthy, takes med every day, just had knee surgery. has started back exercising some.    Reflux causing cough and burning in throat.  Use to be on ranitidine and then tried pepcid and now on protonix.   Would like to get new shingles vaccine.   Virtual Visit via Video Note  I connected with Connie Colon on 09/08/18 at 11:00 AM EDT by a video enabled telemedicine application and verified that I am speaking with the correct person using two identifiers.  Location: Patient: home Provider: office   I discussed the limitations of evaluation and management by telemedicine and the availability of in person appointments. The patient expressed understanding and agreed to proceed.  History of Present Illness:    Observations/Objective:   Assessment and Plan:   Follow Up Instructions:    I discussed the assessment and treatment plan with the patient. The patient was provided an opportunity to ask questions and all were answered. The patient agreed with the plan and demonstrated an understanding of the instructions.   The patient was advised to call back or seek an in-person evaluation if the symptoms worsen or if the condition fails to improve as anticipated.  I provided 25 minutes of non-face-to-face time during this encounter.  Patient continues to take lipid medication regularly. No obvious side effects from it. Generally does not miss a dose. Prior blood work results are reviewed with patient. Patient continues to work on fat intake in diet    Reflux.  Still episodes.  Ongoing challenge for the patient.  Throat irritation.  Cough at times.  Compliant with the Protonix.   Generalized anxiety overall in decent control.  Compliant with medications.  Utilizing  medicine appropriately.  No obvious negative side effects. Review of Systems No headache, no major weight loss or weight gain, no chest pain no back pain abdominal pain no change in bowel habits complete ROS otherwise negative     Objective:   Physical Exam   Virtual     Assessment & Plan:  Impression generalized anxiety disorder discussed.  Exercise encouraged compliance with medication discussed  2.  Reflux.  Control good but not ideal.  Told patient I think this is as good as we will get for now.  If worsens would recommend GI referral patient wishes to hold off at this time  3.  Hyperlipidemia.  Prior blood work reviewed.  Diet discussed exercise discussed.  Continue same treatment

## 2018-09-10 ENCOUNTER — Encounter: Payer: Self-pay | Admitting: Family Medicine

## 2018-10-07 DIAGNOSIS — Z23 Encounter for immunization: Secondary | ICD-10-CM | POA: Diagnosis not present

## 2018-10-13 ENCOUNTER — Other Ambulatory Visit: Payer: Self-pay | Admitting: Family Medicine

## 2018-10-13 NOTE — Telephone Encounter (Signed)
6 mo worth  

## 2018-10-17 ENCOUNTER — Other Ambulatory Visit: Payer: Self-pay | Admitting: Family Medicine

## 2018-10-20 ENCOUNTER — Other Ambulatory Visit: Payer: Self-pay | Admitting: Family Medicine

## 2018-10-25 ENCOUNTER — Telehealth: Payer: Self-pay | Admitting: Family Medicine

## 2018-10-25 ENCOUNTER — Other Ambulatory Visit: Payer: Self-pay | Admitting: Family Medicine

## 2018-10-25 NOTE — Telephone Encounter (Signed)
rx called into pharm. Pt notified.

## 2018-10-25 NOTE — Telephone Encounter (Signed)
I see Dr. Richardson Landry approved Alprazolam refill on 9-17 but the pharmacy still doesn't have it.  Can you resend to [facility]?

## 2018-11-17 DIAGNOSIS — Z1283 Encounter for screening for malignant neoplasm of skin: Secondary | ICD-10-CM | POA: Diagnosis not present

## 2018-11-17 DIAGNOSIS — L820 Inflamed seborrheic keratosis: Secondary | ICD-10-CM | POA: Diagnosis not present

## 2018-11-17 DIAGNOSIS — D225 Melanocytic nevi of trunk: Secondary | ICD-10-CM | POA: Diagnosis not present

## 2018-12-06 ENCOUNTER — Encounter: Payer: Self-pay | Admitting: Family Medicine

## 2018-12-06 DIAGNOSIS — Z1231 Encounter for screening mammogram for malignant neoplasm of breast: Secondary | ICD-10-CM | POA: Diagnosis not present

## 2019-01-27 ENCOUNTER — Other Ambulatory Visit: Payer: Self-pay | Admitting: Family Medicine

## 2019-01-30 NOTE — Telephone Encounter (Signed)
Med check up 09/08/18

## 2019-03-02 ENCOUNTER — Encounter: Payer: Self-pay | Admitting: Family Medicine

## 2019-03-06 ENCOUNTER — Ambulatory Visit: Payer: Medicare Other

## 2019-03-09 ENCOUNTER — Ambulatory Visit: Payer: Medicare Other | Admitting: Family Medicine

## 2019-03-10 ENCOUNTER — Ambulatory Visit (INDEPENDENT_AMBULATORY_CARE_PROVIDER_SITE_OTHER): Payer: Medicare Other | Admitting: Family Medicine

## 2019-03-10 ENCOUNTER — Other Ambulatory Visit: Payer: Self-pay

## 2019-03-10 DIAGNOSIS — I10 Essential (primary) hypertension: Secondary | ICD-10-CM

## 2019-03-10 DIAGNOSIS — Z79899 Other long term (current) drug therapy: Secondary | ICD-10-CM | POA: Diagnosis not present

## 2019-03-10 DIAGNOSIS — G250 Essential tremor: Secondary | ICD-10-CM | POA: Diagnosis not present

## 2019-03-10 DIAGNOSIS — F411 Generalized anxiety disorder: Secondary | ICD-10-CM

## 2019-03-10 DIAGNOSIS — E785 Hyperlipidemia, unspecified: Secondary | ICD-10-CM | POA: Diagnosis not present

## 2019-03-10 MED ORDER — ESCITALOPRAM OXALATE 10 MG PO TABS: 10.0000 mg | ORAL_TABLET | Freq: Every day | ORAL | 1 refills | Status: DC

## 2019-03-10 MED ORDER — ATORVASTATIN CALCIUM 20 MG PO TABS: ORAL_TABLET | ORAL | 1 refills | Status: DC

## 2019-03-10 MED ORDER — PANTOPRAZOLE SODIUM 40 MG PO TBEC: 40.0000 mg | DELAYED_RELEASE_TABLET | Freq: Every day | ORAL | 5 refills | Status: DC

## 2019-03-10 NOTE — Progress Notes (Signed)
   Subjective:  Audio only  Patient ID: Connie Colon, female    DOB: 1950-01-30, 69 y.o.   MRN: [id number]  Hyperlipidemia This is a chronic problem. The current episode started more than 1 year ago. Treatments tried: lipitor. There are no compliance problems.  Risk factors for coronary artery disease include dyslipidemia and post-menopausal.      Review of Systems Virtual Visit via Video Note  I connected with Connie Colon on 03/10/19 at  2:00 PM EST by a video enabled telemedicine application and verified that I am speaking with the correct person using two identifiers.  Location: Patient: home Provider: office   I discussed the limitations of evaluation and management by telemedicine and the availability of in person appointments. The patient expressed understanding and agreed to proceed.  History of Present Illness:    Observations/Objective:   Assessment and Plan:   Follow Up Instructions:    I discussed the assessment and treatment plan with the patient. The patient was provided an opportunity to ask questions and all were answered. The patient agreed with the plan and demonstrated an understanding of the instructions.   The patient was advised to call back or seek an in-person evaluation if the symptoms worsen or if the condition fails to improve as anticipated.  I provided 24 minutes of non-face-to-face time during this encounter.  Blood pressure medicine and blood pressure levels reviewed today with patient. Compliant with blood pressure medicine. States does not miss a dose. No obvious side effects. Blood pressure generally good when checked elsewhere. Watching salt intake.   Patient continues to take lipid medication regularly. No obvious side effects from it. Generally does not miss a dose. Prior blood work results are reviewed with patient. Patient continues to work on fat intake in diet  Ongoing challenges with anxiety.  States the medication  definitely helps her.  Every time she tries to get off reflux medicine she has resurgence of reflux symptoms      Objective:   Physical Exam  Virtual      Assessment & Plan:  Impression #1 hypertension currently controlled discussed maintain same meds  2.  Hyperlipidemia uncertain time for blood work discussed patient's child  3.  Reflux chronic ongoing need for medications  4.  Chronic anxiety without note insomnia.  Patient has worked her way off Xanax.  Still utilizing Lexapro  Medications refilled.  Appropriate blood work.  Follow-up in 6 months

## 2019-03-21 DIAGNOSIS — E785 Hyperlipidemia, unspecified: Secondary | ICD-10-CM | POA: Diagnosis not present

## 2019-03-21 DIAGNOSIS — I10 Essential (primary) hypertension: Secondary | ICD-10-CM | POA: Diagnosis not present

## 2019-03-21 DIAGNOSIS — Z79899 Other long term (current) drug therapy: Secondary | ICD-10-CM | POA: Diagnosis not present

## 2019-03-22 LAB — LIPID PANEL
Chol/HDL Ratio: 4.6 ratio — ABNORMAL HIGH (ref 0.0–4.4)
Cholesterol, Total: 162 mg/dL (ref 100–199)
HDL: 35 mg/dL — ABNORMAL LOW (ref >39)
LDL Chol Calc (NIH): 82 mg/dL (ref 0–99)
Triglycerides: 272 mg/dL — ABNORMAL HIGH (ref 0–149)
VLDL Cholesterol Cal: 45 mg/dL — ABNORMAL HIGH (ref 5–40)

## 2019-03-22 LAB — BASIC METABOLIC PANEL
BUN/Creatinine Ratio: 14 (ref 12–28)
BUN: 9 mg/dL (ref 8–27)
CO2: 26 mmol/L (ref 20–29)
Calcium: 9.2 mg/dL (ref 8.7–10.3)
Chloride: 102 mmol/L (ref 96–106)
Creatinine, Ser: 0.66 mg/dL (ref 0.57–1.00)
GFR calc Af Amer: 106 mL/min/{1.73_m2} (ref >59)
GFR calc non Af Amer: 92 mL/min/{1.73_m2} (ref >59)
Glucose: 95 mg/dL (ref 65–99)
Potassium: 4.3 mmol/L (ref 3.5–5.2)
Sodium: 141 mmol/L (ref 134–144)

## 2019-03-22 LAB — CBC WITH DIFFERENTIAL/PLATELET
Basophils Absolute: 0.1 10*3/uL (ref 0.0–0.2)
Basos: 1 %
EOS (ABSOLUTE): 0.2 10*3/uL (ref 0.0–0.4)
Eos: 2 %
Hematocrit: 41.1 % (ref 34.0–46.6)
Hemoglobin: 14.4 g/dL (ref 11.1–15.9)
Immature Grans (Abs): 0 10*3/uL (ref 0.0–0.1)
Immature Granulocytes: 0 %
Lymphocytes Absolute: 2.6 10*3/uL (ref 0.7–3.1)
Lymphs: 27 %
MCH: 29.9 pg (ref 26.6–33.0)
MCHC: 35 g/dL (ref 31.5–35.7)
MCV: 85 fL (ref 79–97)
Monocytes Absolute: 0.6 10*3/uL (ref 0.1–0.9)
Monocytes: 6 %
Neutrophils Absolute: 6.1 10*3/uL (ref 1.4–7.0)
Neutrophils: 64 %
Platelets: 318 10*3/uL (ref 150–450)
RBC: 4.82 x10E6/uL (ref 3.77–5.28)
RDW: 11.7 % (ref 11.7–15.4)
WBC: 9.6 10*3/uL (ref 3.4–10.8)

## 2019-03-22 LAB — HEPATIC FUNCTION PANEL
ALT: 18 IU/L (ref 0–32)
AST: 26 IU/L (ref 0–40)
Albumin: 4.3 g/dL (ref 3.8–4.8)
Alkaline Phosphatase: 84 IU/L (ref 39–117)
Bilirubin Total: 0.5 mg/dL (ref 0.0–1.2)
Bilirubin, Direct: 0.12 mg/dL (ref 0.00–0.40)
Total Protein: 6.8 g/dL (ref 6.0–8.5)

## 2019-03-26 ENCOUNTER — Encounter: Payer: Self-pay | Admitting: Family Medicine

## 2019-05-19 ENCOUNTER — Other Ambulatory Visit: Payer: Self-pay | Admitting: *Deleted

## 2019-05-19 ENCOUNTER — Other Ambulatory Visit: Payer: Self-pay | Admitting: Family Medicine

## 2019-05-19 ENCOUNTER — Telehealth: Payer: Self-pay | Admitting: *Deleted

## 2019-05-19 MED ORDER — SHINGRIX 50 MCG/0.5ML IM SUSR: 0.5000 mL | Freq: Once | INTRAMUSCULAR | 1 refills | Status: AC

## 2019-05-19 NOTE — Telephone Encounter (Signed)
My chart message from patients husband:  Dr.Steve,my wife,Cindy and I would like to get the new Shingles vaccination.We both have been vaccinated,but understand that the newer vaccination is more effective.Would you advise this and how do we go about getting vaccinated if you think it is advisable?Alois Cliche   Per Dr. Richardson Landry ok to send.

## 2019-05-19 NOTE — Telephone Encounter (Signed)
Med check up 03/19/19

## 2019-07-13 DIAGNOSIS — H40001 Preglaucoma, unspecified, right eye: Secondary | ICD-10-CM | POA: Diagnosis not present

## 2019-07-19 ENCOUNTER — Telehealth: Payer: Self-pay | Admitting: Family Medicine

## 2019-07-19 DIAGNOSIS — Z79899 Other long term (current) drug therapy: Secondary | ICD-10-CM

## 2019-07-19 DIAGNOSIS — Z0000 Encounter for general adult medical examination without abnormal findings: Secondary | ICD-10-CM

## 2019-07-19 DIAGNOSIS — I10 Essential (primary) hypertension: Secondary | ICD-10-CM

## 2019-07-19 DIAGNOSIS — E785 Hyperlipidemia, unspecified: Secondary | ICD-10-CM

## 2019-07-19 NOTE — Telephone Encounter (Signed)
Pt has physical in August and would like lab work done before appt.

## 2019-07-19 NOTE — Telephone Encounter (Signed)
Last labs 03/21/19 lipid, liver, bmp, cbc

## 2019-07-20 NOTE — Telephone Encounter (Signed)
Orders put in and pt was notified.  

## 2019-07-20 NOTE — Telephone Encounter (Signed)
Yes, please order these and add tsh, (under thyroid screening test or under physical), which ever she is coming in for. Thx.   Dr. Lovena Le

## 2019-09-04 DIAGNOSIS — Z0000 Encounter for general adult medical examination without abnormal findings: Secondary | ICD-10-CM | POA: Diagnosis not present

## 2019-09-04 DIAGNOSIS — E785 Hyperlipidemia, unspecified: Secondary | ICD-10-CM | POA: Diagnosis not present

## 2019-09-04 DIAGNOSIS — Z79899 Other long term (current) drug therapy: Secondary | ICD-10-CM | POA: Diagnosis not present

## 2019-09-04 DIAGNOSIS — I10 Essential (primary) hypertension: Secondary | ICD-10-CM | POA: Diagnosis not present

## 2019-09-05 LAB — BASIC METABOLIC PANEL
BUN/Creatinine Ratio: 13 (ref 12–28)
BUN: 11 mg/dL (ref 8–27)
CO2: 26 mmol/L (ref 20–29)
Calcium: 9.6 mg/dL (ref 8.7–10.3)
Chloride: 102 mmol/L (ref 96–106)
Creatinine, Ser: 0.82 mg/dL (ref 0.57–1.00)
GFR calc Af Amer: 85 mL/min/{1.73_m2} (ref >59)
GFR calc non Af Amer: 74 mL/min/{1.73_m2} (ref >59)
Glucose: 104 mg/dL — ABNORMAL HIGH (ref 65–99)
Potassium: 4.8 mmol/L (ref 3.5–5.2)
Sodium: 139 mmol/L (ref 134–144)

## 2019-09-05 LAB — CBC WITH DIFFERENTIAL/PLATELET
Basophils Absolute: 0.1 10*3/uL (ref 0.0–0.2)
Basos: 1 %
EOS (ABSOLUTE): 0.2 10*3/uL (ref 0.0–0.4)
Eos: 2 %
Hematocrit: 43 % (ref 34.0–46.6)
Hemoglobin: 14.4 g/dL (ref 11.1–15.9)
Immature Grans (Abs): 0 10*3/uL (ref 0.0–0.1)
Immature Granulocytes: 0 %
Lymphocytes Absolute: 2.5 10*3/uL (ref 0.7–3.1)
Lymphs: 31 %
MCH: 29.6 pg (ref 26.6–33.0)
MCHC: 33.5 g/dL (ref 31.5–35.7)
MCV: 88 fL (ref 79–97)
Monocytes Absolute: 0.4 10*3/uL (ref 0.1–0.9)
Monocytes: 5 %
Neutrophils Absolute: 4.8 10*3/uL (ref 1.4–7.0)
Neutrophils: 61 %
Platelets: 332 10*3/uL (ref 150–450)
RBC: 4.87 x10E6/uL (ref 3.77–5.28)
RDW: 11.8 % (ref 11.7–15.4)
WBC: 8 10*3/uL (ref 3.4–10.8)

## 2019-09-05 LAB — HEPATIC FUNCTION PANEL
ALT: 20 IU/L (ref 0–32)
AST: 25 IU/L (ref 0–40)
Albumin: 4.4 g/dL (ref 3.8–4.8)
Alkaline Phosphatase: 97 IU/L (ref 48–121)
Bilirubin Total: 0.7 mg/dL (ref 0.0–1.2)
Bilirubin, Direct: 0.17 mg/dL (ref 0.00–0.40)
Total Protein: 7 g/dL (ref 6.0–8.5)

## 2019-09-05 LAB — LIPID PANEL
Chol/HDL Ratio: 4.8 ratio — ABNORMAL HIGH (ref 0.0–4.4)
Cholesterol, Total: 157 mg/dL (ref 100–199)
HDL: 33 mg/dL — ABNORMAL LOW (ref >39)
LDL Chol Calc (NIH): 79 mg/dL (ref 0–99)
Triglycerides: 272 mg/dL — ABNORMAL HIGH (ref 0–149)
VLDL Cholesterol Cal: 45 mg/dL — ABNORMAL HIGH (ref 5–40)

## 2019-09-05 LAB — TSH: TSH: 3.09 u[IU]/mL (ref 0.450–4.500)

## 2019-09-07 ENCOUNTER — Ambulatory Visit: Payer: Medicare Other | Admitting: Family Medicine

## 2019-09-12 ENCOUNTER — Ambulatory Visit (INDEPENDENT_AMBULATORY_CARE_PROVIDER_SITE_OTHER): Payer: Medicare Other | Admitting: Family Medicine

## 2019-09-12 ENCOUNTER — Other Ambulatory Visit: Payer: Self-pay

## 2019-09-12 VITALS — BP 126/84 | HR 91 | Temp 97.8°F | Ht 61.0 in | Wt 140.2 lb

## 2019-09-12 DIAGNOSIS — I10 Essential (primary) hypertension: Secondary | ICD-10-CM | POA: Diagnosis not present

## 2019-09-12 DIAGNOSIS — N898 Other specified noninflammatory disorders of vagina: Secondary | ICD-10-CM

## 2019-09-12 DIAGNOSIS — K219 Gastro-esophageal reflux disease without esophagitis: Secondary | ICD-10-CM

## 2019-09-12 DIAGNOSIS — E782 Mixed hyperlipidemia: Secondary | ICD-10-CM

## 2019-09-12 DIAGNOSIS — F411 Generalized anxiety disorder: Secondary | ICD-10-CM | POA: Diagnosis not present

## 2019-09-12 DIAGNOSIS — M79645 Pain in left finger(s): Secondary | ICD-10-CM | POA: Diagnosis not present

## 2019-09-12 DIAGNOSIS — Z23 Encounter for immunization: Secondary | ICD-10-CM

## 2019-09-12 DIAGNOSIS — G8929 Other chronic pain: Secondary | ICD-10-CM | POA: Diagnosis not present

## 2019-09-12 DIAGNOSIS — Z0000 Encounter for general adult medical examination without abnormal findings: Secondary | ICD-10-CM | POA: Diagnosis not present

## 2019-09-12 MED ORDER — ESTRADIOL 0.1 MG/GM VA CREA: TOPICAL_CREAM | VAGINAL | 0 refills | Status: DC

## 2019-09-12 MED ORDER — ESCITALOPRAM OXALATE 10 MG PO TABS: 10.0000 mg | ORAL_TABLET | Freq: Every day | ORAL | 1 refills | Status: DC

## 2019-09-12 NOTE — Progress Notes (Signed)
Patient ID: Connie Colon, female    DOB: Feb 09, 1950, 69 y.o.   MRN: [id number]   Chief Complaint  Patient presents with  . Annual Exam   Subjective:    HPI  Pt seen for AMWV.  Pt was ppi and stopped bc it was upsetting her stomach. Then taking qod then taking daily. Has bed wedge. Gave diarrhea and stomach pain.  Was on protonix. Burning pain and feeling, sweating, and almost going to pass out. Pt stopped the med.  Has itching in vaginal area. Worse at night. Going on for years.  Use prep H ointment for years. Used to use estrace cream but stopped it 1.5 yrs ago on her own.  Had mammo in 11/20. HM-colonoscopy- 2017. Return in 10 yrs. Pt used to be on estrace cream, 1 fingertip amt into vaginal area. Pt decided to stop this herself.  No h/o breast or uterine cancer for her or family.   The patient comes in today for a wellness visit.   A review of their health history was completed.  A review of medications was also completed.  Any needed refills; yes  Eating habits: good  Falls/  MVA accidents in past few months: none  Regular exercise: aerobic exercise 20 min 6 days a week  Specialist pt sees on regular basis: none  Preventative health issues were discussed.   Additional concerns: 1-Stopped protonix- it helped reflux but made her have severe diarrhea and upset her stomach and had vaso vagal spell during diarrhea episode 2- arthritis in left thumb 3- check ears for wax- ?referral to ENT 4- tingling in left lower leg in July but none since  Medical History Connie Colon has a past medical history of Anxiety, Arthritis, Carpal tunnel syndrome of left wrist, GERD (gastroesophageal reflux disease), Hyperlipemia, Seasonal allergies, and Tinnitus.   Outpatient Encounter Medications as of 09/12/2019  Medication Sig  . atorvastatin (LIPITOR) 20 MG tablet Take one tablet by mouth everyday at bedtime  . BIOTIN PO Take 1 tablet by mouth daily.   . chlorzoxazone  (PARAFON) 500 MG tablet TAKE 1/2 TABLET BY MOUTH UP TO 4 TIMES PER DAY  . cholecalciferol (VITAMIN D) 1000 UNITS tablet Take 1,000 Units by mouth daily.  Marland Kitchen escitalopram (LEXAPRO) 10 MG tablet Take 1 tablet (10 mg total) by mouth daily.  Marland Kitchen estradiol (ESTRACE VAGINAL) 0.1 MG/GM vaginal cream Apply topically to vaginal opening with fingertip.  Marland Kitchen metroNIDAZOLE (METROCREAM) 0.75 % cream APPLY TOPICALLY 2 TIMES DAILY TO FACE  . Multiple Vitamin (MULITIVITAMIN WITH MINERALS) TABS Take 1 tablet by mouth daily. Women's One-A-Day  . Multiple Vitamins-Minerals (ICAPS) CAPS Take 1 capsule by mouth daily.   . Omega-3 Fatty Acids (FISH OIL) 1000 MG CAPS Take 1,000-2,000 mg by mouth See admin instructions. Take 1000 mg by mouth in the morning and take 2000 mg by mouth at bedtime  . [DISCONTINUED] escitalopram (LEXAPRO) 10 MG tablet Take 1 tablet (10 mg total) by mouth daily.  . [DISCONTINUED] pantoprazole (PROTONIX) 40 MG tablet Take 1 tablet (40 mg total) by mouth daily. (Patient not taking: Reported on 09/12/2019)   No facility-administered encounter medications on file as of 09/12/2019.     Review of Systems  Constitutional: Negative for chills and fever.  HENT: Negative for congestion, rhinorrhea and sore throat.   Respiratory: Negative for cough, shortness of breath and wheezing.   Cardiovascular: Negative for chest pain and leg swelling.  Gastrointestinal: Negative for abdominal pain, diarrhea, nausea and vomiting.       +  reflux  Genitourinary: Negative for dysuria and frequency.       +vaginal itching  Musculoskeletal: Negative for arthralgias and back pain.       +Thumb pain  Skin: Negative for rash.  Neurological: Negative for dizziness, weakness and headaches.     Vitals BP 126/84   Pulse 91   Temp 97.8 F (36.6 C) (Oral)   Ht 5\' 1"  (1.549 m)   Wt 140 lb 3.2 oz (63.6 kg)   SpO2 96%   BMI 26.49 kg/m   Objective:   Physical Exam Vitals and nursing note reviewed.  Constitutional:       General: She is not in acute distress.    Appearance: Normal appearance.  HENT:     Head: Normocephalic and atraumatic.     Right Ear: Tympanic membrane, ear canal and external ear normal.     Left Ear: Tympanic membrane, ear canal and external ear normal.     Nose: Nose normal. No congestion or rhinorrhea.     Mouth/Throat:     Mouth: Mucous membranes are moist.     Pharynx: Oropharynx is clear. No oropharyngeal exudate or posterior oropharyngeal erythema.  Eyes:     Extraocular Movements: Extraocular movements intact.     Conjunctiva/sclera: Conjunctivae normal.     Pupils: Pupils are equal, round, and reactive to light.  Cardiovascular:     Rate and Rhythm: Normal rate and regular rhythm.     Pulses: Normal pulses.     Heart sounds: Normal heart sounds.  Pulmonary:     Effort: Pulmonary effort is normal.     Breath sounds: Normal breath sounds. No wheezing, rhonchi or rales.  Abdominal:     General: Abdomen is flat. Bowel sounds are normal. There is no distension.     Palpations: Abdomen is soft. There is no mass.     Tenderness: There is no abdominal tenderness. There is no guarding or rebound.     Hernia: No hernia is present.  Genitourinary:    Comments: +erythema in the vaginal itroitus and small circular area of erythema and pruritis on rt inner vulva. Musculoskeletal:        General: Normal range of motion.     Right lower leg: No edema.     Left lower leg: No edema.  Skin:    General: Skin is warm and dry.     Findings: No lesion or rash.  Neurological:     General: No focal deficit present.     Mental Status: She is alert and oriented to person, place, and time.     Cranial Nerves: No cranial nerve deficit.  Psychiatric:        Mood and Affect: Mood normal.        Behavior: Behavior normal.      Assessment and Plan   1. Essential hypertension  2. Generalized anxiety disorder - escitalopram (LEXAPRO) 10 MG tablet; Take 1 tablet (10 mg total) by mouth  daily.  Dispense: 90 tablet; Refill: 1  3. Mixed hyperlipidemia  4. Need for vaccination - Pneumococcal polysaccharide vaccine 23-valent greater than or equal to 2yo subcutaneous/IM  5. Vaginal itching  6. Chronic pain of left thumb  7. Gastroesophageal reflux disease without esophagitis   HLD- elevated TGs and low HDL.  Cont lipitor. GAD- doing well on lexapro. Stable. htn- stable, .not on meds. Cont diet modification. Vaginal itching vs. Vaginal atrophy- restart estrace to see if helping with the itching.  chronic left thumb pain- likely  OA of the thumb.  Cont tylenol, rest, ice prn. gerd- pt stopped ppi due to stomach pain. Use tums or otc medications prn. Intermittent tingling in left lower leg.  Call or rto if worsening or persistent.   F/u 41mo or prn.

## 2019-09-18 ENCOUNTER — Encounter: Payer: Self-pay | Admitting: Family Medicine

## 2019-10-05 ENCOUNTER — Ambulatory Visit: Payer: Medicare Other | Attending: Internal Medicine

## 2019-10-05 DIAGNOSIS — Z23 Encounter for immunization: Secondary | ICD-10-CM

## 2019-10-05 NOTE — Progress Notes (Signed)
   Cov[facility] Vaccination [facility]  Name:  Connie Colon    MRN: [id number] DOB: [birth year]  10/05/2019  [name] was observed post Covid-19 immunization for 15 minutes without incident. She was provided with Vaccine Information Sheet and instruction to access the V-Safe system.   [name] was instructed to call 911 with any severe reactions post vaccine: Marland Kitchen Difficulty breathing  . Swelling of face and throat  . A fast heartbeat  . A bad rash all over body  . Dizziness and weakness

## 2019-10-19 ENCOUNTER — Other Ambulatory Visit: Payer: Self-pay | Admitting: *Deleted

## 2019-10-19 MED ORDER — ATORVASTATIN CALCIUM 20 MG PO TABS: ORAL_TABLET | ORAL | 0 refills | Status: DC

## 2019-11-16 DIAGNOSIS — Z23 Encounter for immunization: Secondary | ICD-10-CM | POA: Diagnosis not present

## 2019-11-29 ENCOUNTER — Other Ambulatory Visit: Payer: Self-pay | Admitting: *Deleted

## 2019-11-29 MED ORDER — METRONIDAZOLE 0.75 % EX CREA: TOPICAL_CREAM | CUTANEOUS | 1 refills | Status: AC

## 2019-12-04 ENCOUNTER — Other Ambulatory Visit: Payer: Self-pay

## 2019-12-04 ENCOUNTER — Ambulatory Visit (INDEPENDENT_AMBULATORY_CARE_PROVIDER_SITE_OTHER): Payer: Medicare Other | Admitting: Family Medicine

## 2019-12-04 ENCOUNTER — Encounter: Payer: Self-pay | Admitting: Family Medicine

## 2019-12-04 VITALS — BP 134/84 | HR 91 | Temp 97.0°F | Wt 143.6 lb

## 2019-12-04 DIAGNOSIS — M79601 Pain in right arm: Secondary | ICD-10-CM | POA: Diagnosis not present

## 2019-12-04 DIAGNOSIS — S56919A Strain of unspecified muscles, fascia and tendons at forearm level, unspecified arm, initial encounter: Secondary | ICD-10-CM

## 2019-12-04 MED ORDER — HYDROCODONE-ACETAMINOPHEN 10-325 MG PO TABS: ORAL_TABLET | ORAL | 0 refills | Status: DC

## 2019-12-04 MED ORDER — DICLOFENAC SODIUM 75 MG PO TBEC: 75.0000 mg | DELAYED_RELEASE_TABLET | Freq: Two times a day (BID) | ORAL | 0 refills | Status: DC

## 2019-12-04 NOTE — Patient Instructions (Addendum)
Alternate ice and heat 20 minutes each time every 1-2 hours Take Voltarin in morning, Tylenol 500 mg at lunch, Voltarin at dinner, then one Vicodin at bedtime. Always take Voltarin with food. Rest your arm.      Muscle Strain A muscle strain is an injury that happens when a muscle is stretched longer than normal. This can happen during a fall, sports, or lifting. This can tear some muscle fibers. Usually, recovery from muscle strain takes 1-2 weeks. Complete healing normally takes 5-6 weeks. This condition is first treated with PRICE therapy. This involves:  Protecting your muscle from being injured again.  Resting your injured muscle.  Icing your injured muscle.  Applying pressure (compression) to your injured muscle. This may be done with a splint or elastic bandage.  Raising (elevating) your injured muscle. Your doctor may also recommend medicine for pain. Follow these instructions at home: If you have a splint:  Wear the splint as told by your doctor. Take it off only as told by your doctor.  Loosen the splint if your fingers or toes tingle, get numb, or turn cold and blue.  Keep the splint clean.  If the splint is not waterproof: ? Do not let it get wet. ? Cover it with a watertight covering when you take a bath or a shower. Managing pain, stiffness, and swelling   If directed, put ice on your injured area. ? If you have a removable splint, take it off as told by your doctor. ? Put ice in a plastic bag. ? Place a towel between your skin and the bag. ? Leave the ice on for 20 minutes, 2-3 times a day.  Move your fingers or toes often. This helps to avoid stiffness and lessen swelling.  Raise your injured area above the level of your heart while you are sitting or lying down.  Wear an elastic bandage as told by your doctor. Make sure it is not too tight. General instructions  Take over-the-counter and prescription medicines only as told by your doctor.  Limit  your activity. Rest your injured muscle as told by your doctor. Your doctor may say that gentle movements are okay.  If physical therapy was prescribed, do exercises as told by your doctor.  Do not put pressure on any part of the splint until it is fully hardened. This may take many hours.  Do not use any products that contain nicotine or tobacco, such as cigarettes and e-cigarettes. These can delay bone healing. If you need help quitting, ask your doctor.  Warm up before you exercise. This helps to prevent more muscle strains.  Ask your doctor when it is safe to drive if you have a splint.  Keep all follow-up visits as told by your doctor. This is important. Contact a doctor if:  You have more pain or swelling in your injured area. Get help right away if:  You have any of these problems in your injured area: ? You have numbness. ? You have tingling. ? You lose a lot of strength. Summary  A muscle strain is an injury that happens when a muscle is stretched longer than normal.  This condition is first treated with PRICE therapy. This includes protecting, resting, icing, adding pressure, and raising your injury.  Limit your activity. Rest your injured muscle as told by your doctor. Your doctor may say that gentle movements are okay.  Warm up before you exercise. This helps to prevent more muscle strains. This information is not  intended to replace advice given to you by your health care provider. Make sure you discuss any questions you have with your health care provider. Document Revised: 03/10/2018 Document Reviewed: 02/19/2016 Elsevier Patient Education  [place] DASH stands for "Dietary Approaches to Stop Hypertension." The DASH eating plan is a healthy eating plan that has been shown to reduce high blood pressure (hypertension). It may also reduce your risk for type 2 diabetes, heart disease, and stroke. The DASH eating plan may also help with  weight loss. What are tips for following this plan?  General guidelines  Avoid eating more than 2,300 mg (milligrams) of salt (sodium) a day. If you have hypertension, you may need to reduce your sodium intake to 1,500 mg a day.  Limit alcohol intake to no more than 1 drink a day for nonpregnant women and 2 drinks a day for men. One drink equals 12 oz of beer, 5 oz of wine, or 1 oz of hard liquor.  Work with your health care provider to maintain a healthy body weight or to lose weight. Ask what an ideal weight is for you.  Get at least 30 minutes of exercise that causes your heart to beat faster (aerobic exercise) most days of the week. Activities may include walking, swimming, or biking.  Work with your health care provider or diet and nutrition specialist (dietitian) to adjust your eating plan to your individual calorie needs. Reading food labels   Check food labels for the amount of sodium per serving. Choose foods with less than 5 percent of the Daily Value of sodium. Generally, foods with less than 300 mg of sodium per serving fit into this eating plan.  To find whole grains, look for the word "whole" as the first word in the ingredient list. Shopping  Buy products labeled as "low-sodium" or "no salt added."  Buy fresh foods. Avoid canned foods and premade or frozen meals. Cooking  Avoid adding salt when cooking. Use salt-free seasonings or herbs instead of table salt or sea salt. Check with your health care provider or pharmacist before using salt substitutes.  Do not fry foods. Cook foods using healthy methods such as baking, boiling, grilling, and broiling instead.  Cook with heart-healthy oils, such as olive, canola, soybean, or sunflower oil. Meal planning  Eat a balanced diet that includes: ? 5 or more servings of fruits and vegetables each day. At each meal, try to fill half of your plate with fruits and vegetables. ? Up to 6-8 servings of whole grains each  day. ? Less than 6 oz of lean meat, poultry, or fish each day. A 3-oz serving of meat is about the same size as a deck of cards. One egg equals 1 oz. ? 2 servings of low-fat dairy each day. ? A serving of nuts, seeds, or beans 5 times each week. ? Heart-healthy fats. Healthy fats called Omega-3 fatty acids are found in foods such as flaxseeds and coldwater fish, like sardines, salmon, and mackerel.  Limit how much you eat of the following: ? Canned or prepackaged foods. ? Food that is high in trans fat, such as fried foods. ? Food that is high in saturated fat, such as fatty meat. ? Sweets, desserts, sugary drinks, and other foods with added sugar. ? Full-fat dairy products.  Do not salt foods before eating.  Try to eat at least 2 vegetarian meals each week.  Eat more home-cooked food and less restaurant, buffet, and  fast food.  When eating at a restaurant, ask that your food be prepared with less salt or no salt, if possible. What foods are recommended? The items listed may not be a complete list. Talk with your dietitian about what dietary choices are best for you. Grains Whole-grain or whole-wheat bread. Whole-grain or whole-wheat pasta. Brown rice. [place]. Bulgur. Whole-grain and low-sodium cereals. Pita bread. Low-fat, low-sodium crackers. Whole-wheat flour tortillas. Vegetables Fresh or frozen vegetables (raw, steamed, roasted, or grilled). Low-sodium or reduced-sodium tomato and vegetable juice. Low-sodium or reduced-sodium tomato sauce and tomato paste. Low-sodium or reduced-sodium canned vegetables. Fruits All fresh, dried, or frozen fruit. Canned fruit in natural juice (without added sugar). Meat and other protein foods Skinless chicken or [place]. Ground chicken or [place]. Pork with fat trimmed off. Fish and seafood. Egg whites. Dried beans, peas, or lentils. Unsalted nuts, nut butters, and seeds. Unsalted canned beans. Lean cuts of beef with fat trimmed off.  Low-sodium, lean deli meat. Dairy Low-fat (1%) or fat-free (skim) milk. Fat-free, low-fat, or reduced-fat cheeses. Nonfat, low-sodium ricotta or cottage cheese. Low-fat or nonfat yogurt. Low-fat, low-sodium cheese. Fats and oils Soft margarine without trans fats. Vegetable oil. Low-fat, reduced-fat, or light mayonnaise and salad dressings (reduced-sodium). Canola, safflower, olive, soybean, and sunflower oils. Avocado. Seasoning and other foods Herbs. Spices. Seasoning mixes without salt. Unsalted popcorn and pretzels. Fat-free sweets. What foods are not recommended? The items listed may not be a complete list. Talk with your dietitian about what dietary choices are best for you. Grains Baked goods made with fat, such as croissants, muffins, or some breads. Dry pasta or rice meal packs. Vegetables Creamed or fried vegetables. Vegetables in a cheese sauce. Regular canned vegetables (not low-sodium or reduced-sodium). Regular canned tomato sauce and paste (not low-sodium or reduced-sodium). Regular tomato and vegetable juice (not low-sodium or reduced-sodium). Angie Fava. Olives. Fruits Canned fruit in a light or heavy syrup. Fried fruit. Fruit in cream or butter sauce. Meat and other protein foods Fatty cuts of meat. Ribs. Fried meat. Berniece Salines. Sausage. Bologna and other processed lunch meats. Salami. Fatback. Hotdogs. Bratwurst. Salted nuts and seeds. Canned beans with added salt. Canned or smoked fish. Whole eggs or egg yolks. Chicken or [place] with skin. Dairy Whole or 2% milk, cream, and half-and-half. Whole or full-fat cream cheese. Whole-fat or sweetened yogurt. Full-fat cheese. Nondairy creamers. Whipped toppings. Processed cheese and cheese spreads. Fats and oils Butter. Stick margarine. Lard. Shortening. Ghee. Bacon fat. Tropical oils, such as coconut, palm kernel, or palm oil. Seasoning and other foods Salted popcorn and pretzels. Onion salt, garlic salt, seasoned salt, table salt, and sea  salt. Worcestershire sauce. Tartar sauce. Barbecue sauce. Teriyaki sauce. Soy sauce, including reduced-sodium. Steak sauce. Canned and packaged gravies. Fish sauce. Oyster sauce. Cocktail sauce. Horseradish that you find on the shelf. Ketchup. Mustard. Meat flavorings and tenderizers. Bouillon cubes. Hot sauce and Tabasco sauce. Premade or packaged marinades. Premade or packaged taco seasonings. Relishes. Regular salad dressings. Where to find more information:  National Heart, Lung, and [place]: https://wilson-eaton.com/  American Heart Association: www.heart.org Summary  The DASH eating plan is a healthy eating plan that has been shown to reduce high blood pressure (hypertension). It may also reduce your risk for type 2 diabetes, heart disease, and stroke.  With the DASH eating plan, you should limit salt (sodium) intake to 2,300 mg a day. If you have hypertension, you may need to reduce your sodium intake to 1,500 mg a day.  When on the  DASH eating plan, aim to eat more fresh fruits and vegetables, whole grains, lean proteins, low-fat dairy, and heart-healthy fats.  Work with your health care provider or diet and nutrition specialist (dietitian) to adjust your eating plan to your individual calorie needs. This information is not intended to replace advice given to you by your health care provider. Make sure you discuss any questions you have with your health care provider. Document Revised: 12/25/2016 Document Reviewed: 01/06/2016 Elsevier Patient Education  [place]:

## 2019-12-04 NOTE — Progress Notes (Addendum)
Patient ID: Connie Colon, female    DOB: 05-05-1950, 69 y.o.   MRN: [id number]   Chief Complaint  Patient presents with   Neck Pain    Patient woke up saturday night with a throbbing pain on the right side of her neck radiating down her right arm and hand.    Subjective:  CC: neck pain  Presents today with neck pain and right side arm shoulder and hand pain.  This is a new problem, started on Saturday morning.  She reports that on Friday she was at [facility], carried her very heavy purse from the parking lot all the way into the hospital which is a long distance.  She reports that she carries the purse hanging not on her shoulder but on her arm.  she awakened that night with her right elbow hurting.  She has tried alternating ibuprofen and Tylenol, and a muscle relaxer which she already has for a different problem.  Pertinent negatives include no fever, no chills, no fatigue, no chest pain, no shortness of breath, no abdominal pain no numbness or tingling.    Medical History Connie Colon has a past medical history of Anxiety, Arthritis, Carpal tunnel syndrome of left wrist, GERD (gastroesophageal reflux disease), Hyperlipemia, Seasonal allergies, and Tinnitus.   Outpatient Encounter Medications as of 12/04/2019  Medication Sig   atorvastatin (LIPITOR) 20 MG tablet Take one tablet by mouth everyday at bedtime   BIOTIN PO Take 1 tablet by mouth daily.    chlorzoxazone (PARAFON) 500 MG tablet TAKE 1/2 TABLET BY MOUTH UP TO 4 TIMES PER DAY   cholecalciferol (VITAMIN D) 1000 UNITS tablet Take 1,000 Units by mouth daily.   diclofenac (VOLTAREN) 75 MG EC tablet Take 1 tablet (75 mg total) by mouth 2 (two) times daily.   escitalopram (LEXAPRO) 10 MG tablet Take 1 tablet (10 mg total) by mouth daily.   estradiol (ESTRACE VAGINAL) 0.1 MG/GM vaginal cream Apply topically to vaginal opening with fingertip.   HYDROcodone-acetaminophen (NORCO) 10-325 MG tablet Take one tablet by  mouth at bedtime   metroNIDAZOLE (METROCREAM) 0.75 % cream APPLY TOPICALLY 2 TIMES DAILY TO FACE   Multiple Vitamin (MULITIVITAMIN WITH MINERALS) TABS Take 1 tablet by mouth daily. Women's One-A-Day   Multiple Vitamins-Minerals (ICAPS) CAPS Take 1 capsule by mouth daily.    Omega-3 Fatty Acids (FISH OIL) 1000 MG CAPS Take 1,000-2,000 mg by mouth See admin instructions. Take 1000 mg by mouth in the morning and take 2000 mg by mouth at bedtime   No facility-administered encounter medications on file as of 12/04/2019.     Review of Systems  Constitutional: Negative for chills and fever.  Respiratory: Negative for shortness of breath.   Cardiovascular: Negative for chest pain.  Gastrointestinal: Negative for abdominal pain.  Neurological: Positive for tremors. Negative for numbness.       Has essential tremors. (Not a new problem).     Vitals BP 134/84    Pulse 91    Temp (!) 97 F (36.1 C)    Wt 143 lb 9.6 oz (65.1 kg)    SpO2 99%    BMI 27.13 kg/m   Objective:   Physical Exam Vitals and nursing note reviewed.  Constitutional:      General: She is not in acute distress.    Appearance: Normal appearance.  Eyes:     Extraocular Movements: Extraocular movements intact.     Pupils: Pupils are equal, round, and reactive to light.  Cardiovascular:  Rate and Rhythm: Normal rate and regular rhythm.     Heart sounds: Normal heart sounds.  Pulmonary:     Effort: Pulmonary effort is normal.     Breath sounds: Normal breath sounds.  Musculoskeletal:        General: Normal range of motion.     Comments: Able to raise right arm over her head without any pain in the right shoulder.  Skin:    General: Skin is warm and dry.  Neurological:     General: No focal deficit present.     Mental Status: She is alert and oriented to person, place, and time.     Cranial Nerves: No cranial nerve deficit.     Sensory: No sensory deficit.     Motor: No weakness.     Coordination:  Coordination normal.     Gait: Gait normal.     Comments: Detailed neurological exam negative for any red flags today.  Strength equal 5/5 bilateral.  No numbness, no tingling reported.  Psychiatric:        Mood and Affect: Mood normal.        Behavior: Behavior normal.        Thought Content: Thought content normal.        Judgment: Judgment normal.      Assessment and Plan   1. Muscle strain of forearm, initial encounter - diclofenac (VOLTAREN) 75 MG EC tablet; Take 1 tablet (75 mg total) by mouth 2 (two) times daily.  Dispense: 30 tablet; Refill: 0 - HYDROcodone-acetaminophen (NORCO) 10-325 MG tablet; Take one tablet by mouth at bedtime  Dispense: 10 tablet; Refill: 0   I feel certain that she has caused her pain by carrying the heavy purse when she was going into [facility] Friday.  EKG done today just to be certain that nothing is going on with her heart today. EKG done: Reviewed with patient, no ectopy, no evidence of ischemia today.  Plan: She will alternate ice and heat for 20 minutes each time every 1-2 hours when she can. She will take Voltaren in the morning, Tylenol 500 mg at lunch, Voltaren at dinner, and 1 Vicodin at bedtime to help her rest. She is instructed to always take Voltaren with food, side effects discussed, GI precautions given. She will rest her arm is much as possible. She will follow up in 2 weeks, to ensure improvement, if she is totally better, she is instructed that she can cancel this appointment.  Agrees with plan of care discussed today. Understands warning signs to seek further care: Numbness, tingling, weakness, any significant changes in health status. Understands to follow-up in 2 weeks, if completely better, can cancel this appointment.

## 2019-12-13 ENCOUNTER — Encounter: Payer: Self-pay | Admitting: Family Medicine

## 2019-12-18 ENCOUNTER — Ambulatory Visit: Payer: Medicare Other | Admitting: Family Medicine

## 2019-12-23 ENCOUNTER — Other Ambulatory Visit: Payer: Self-pay | Admitting: Family Medicine

## 2020-03-05 ENCOUNTER — Telehealth: Payer: Self-pay

## 2020-03-05 DIAGNOSIS — Z79899 Other long term (current) drug therapy: Secondary | ICD-10-CM

## 2020-03-05 DIAGNOSIS — I10 Essential (primary) hypertension: Secondary | ICD-10-CM

## 2020-03-05 DIAGNOSIS — E785 Hyperlipidemia, unspecified: Secondary | ICD-10-CM

## 2020-03-05 NOTE — Telephone Encounter (Signed)
Pt is wanting to know if if she needs blood work ordered before her apt on 02/17   Pt call back [phone] she said you can leave a message if she is not there.

## 2020-03-06 NOTE — Telephone Encounter (Signed)
Yes, pls order cbc, cmp, lipids,   thx. Dr. Lovena Le

## 2020-03-06 NOTE — Telephone Encounter (Signed)
Blood work ordered in Epic. Patient notified. 

## 2020-03-08 DIAGNOSIS — I10 Essential (primary) hypertension: Secondary | ICD-10-CM | POA: Diagnosis not present

## 2020-03-08 DIAGNOSIS — Z79899 Other long term (current) drug therapy: Secondary | ICD-10-CM | POA: Diagnosis not present

## 2020-03-08 DIAGNOSIS — E785 Hyperlipidemia, unspecified: Secondary | ICD-10-CM | POA: Diagnosis not present

## 2020-03-09 LAB — COMPREHENSIVE METABOLIC PANEL
ALT: 16 IU/L (ref 0–32)
AST: 21 IU/L (ref 0–40)
Albumin/Globulin Ratio: 1.6 (ref 1.2–2.2)
Albumin: 4.2 g/dL (ref 3.8–4.8)
Alkaline Phosphatase: 88 IU/L (ref 44–121)
BUN/Creatinine Ratio: 16 (ref 12–28)
BUN: 12 mg/dL (ref 8–27)
Bilirubin Total: 0.7 mg/dL (ref 0.0–1.2)
CO2: 24 mmol/L (ref 20–29)
Calcium: 9.4 mg/dL (ref 8.7–10.3)
Chloride: 103 mmol/L (ref 96–106)
Creatinine, Ser: 0.76 mg/dL (ref 0.57–1.00)
GFR calc Af Amer: 93 mL/min/{1.73_m2} (ref >59)
GFR calc non Af Amer: 81 mL/min/{1.73_m2} (ref >59)
Globulin, Total: 2.6 g/dL (ref 1.5–4.5)
Glucose: 98 mg/dL (ref 65–99)
Potassium: 4.8 mmol/L (ref 3.5–5.2)
Sodium: 141 mmol/L (ref 134–144)
Total Protein: 6.8 g/dL (ref 6.0–8.5)

## 2020-03-09 LAB — LIPID PANEL
Chol/HDL Ratio: 3.8 ratio (ref 0.0–4.4)
Cholesterol, Total: 129 mg/dL (ref 100–199)
HDL: 34 mg/dL — ABNORMAL LOW (ref >39)
LDL Chol Calc (NIH): 66 mg/dL (ref 0–99)
Triglycerides: 168 mg/dL — ABNORMAL HIGH (ref 0–149)
VLDL Cholesterol Cal: 29 mg/dL (ref 5–40)

## 2020-03-09 LAB — CBC WITH DIFFERENTIAL/PLATELET
Basophils Absolute: 0.1 10*3/uL (ref 0.0–0.2)
Basos: 2 %
EOS (ABSOLUTE): 0.2 10*3/uL (ref 0.0–0.4)
Eos: 3 %
Hematocrit: 41.4 % (ref 34.0–46.6)
Hemoglobin: 14.3 g/dL (ref 11.1–15.9)
Immature Grans (Abs): 0 10*3/uL (ref 0.0–0.1)
Immature Granulocytes: 0 %
Lymphocytes Absolute: 2.2 10*3/uL (ref 0.7–3.1)
Lymphs: 29 %
MCH: 29.6 pg (ref 26.6–33.0)
MCHC: 34.5 g/dL (ref 31.5–35.7)
MCV: 86 fL (ref 79–97)
Monocytes Absolute: 0.5 10*3/uL (ref 0.1–0.9)
Monocytes: 6 %
Neutrophils Absolute: 4.6 10*3/uL (ref 1.4–7.0)
Neutrophils: 60 %
Platelets: 341 10*3/uL (ref 150–450)
RBC: 4.83 x10E6/uL (ref 3.77–5.28)
RDW: 11.8 % (ref 11.7–15.4)
WBC: 7.5 10*3/uL (ref 3.4–10.8)

## 2020-03-14 ENCOUNTER — Encounter: Payer: Self-pay | Admitting: Family Medicine

## 2020-03-14 ENCOUNTER — Ambulatory Visit (INDEPENDENT_AMBULATORY_CARE_PROVIDER_SITE_OTHER): Payer: Medicare Other | Admitting: Family Medicine

## 2020-03-14 ENCOUNTER — Other Ambulatory Visit: Payer: Self-pay

## 2020-03-14 VITALS — BP 112/74 | HR 82 | Temp 97.2°F | Ht 61.0 in | Wt 140.0 lb

## 2020-03-14 DIAGNOSIS — N94818 Other vulvodynia: Secondary | ICD-10-CM

## 2020-03-14 DIAGNOSIS — N909 Noninflammatory disorder of vulva and perineum, unspecified: Secondary | ICD-10-CM | POA: Diagnosis not present

## 2020-03-14 DIAGNOSIS — E782 Mixed hyperlipidemia: Secondary | ICD-10-CM

## 2020-03-14 DIAGNOSIS — F411 Generalized anxiety disorder: Secondary | ICD-10-CM | POA: Diagnosis not present

## 2020-03-14 DIAGNOSIS — I10 Essential (primary) hypertension: Secondary | ICD-10-CM

## 2020-03-14 MED ORDER — ATORVASTATIN CALCIUM 20 MG PO TABS
ORAL_TABLET | ORAL | 3 refills | Status: AC
Start: 2020-03-14 — End: ?

## 2020-03-14 MED ORDER — ESCITALOPRAM OXALATE 10 MG PO TABS: 5.0000 mg | ORAL_TABLET | Freq: Every day | ORAL | 1 refills | Status: AC

## 2020-03-14 NOTE — Progress Notes (Signed)
Patient ID: Connie Colon, female    DOB: [birth year], 70 y.o.   MRN: [id number]   Chief Complaint  Patient presents with  . Hyperlipidemia  . Anxiety  . Vaginal Itching   Subjective:    HPImed check up- hld  Would like to have ears checked to see if she has wax build up.  Having some feeling of wax in there.  Anxiety/depression- stable. Taking 1/2 tab daily at night. Made her sleepy. lexaprol 10mg .  HLD- doing well no new concerns.  Compliant with meds. No chest pain, palpitations, myalgias or joint pains.  Cholesterol improving. On lipitor 20mg .  Using vaginal cream, estrace from last visit for vaginal atrophy. Feeling itchy and feeling thick and scabby when taking a bath. Feeling something irregular in that area, then looked with mirror and noticed the skin there  "looks different." Used to see Dr. Derenda Mis- gyn, but retired. Itching near the urethra.  Restarted the estrace cream on her last visit and helped with vaginal itching near introitus.  Feeling cyst in left breast, was aspirated by another doctor, and felt like it's back. Not wanting to have it aspirated again.  Medical History Adora has a past medical history of Anxiety, Arthritis, Carpal tunnel syndrome of left wrist, GERD (gastroesophageal reflux disease), Hyperlipemia, Seasonal allergies, and Tinnitus.   Outpatient Encounter Medications as of 03/14/2020  Medication Sig  . BIOTIN PO Take 1 tablet by mouth daily.   . chlorzoxazone (PARAFON) 500 MG tablet TAKE 1/2 TABLET BY MOUTH UP TO 4 TIMES PER DAY  . cholecalciferol (VITAMIN D) 1000 UNITS tablet Take 1,000 Units by mouth daily.  Marland Kitchen estradiol (ESTRACE VAGINAL) 0.1 MG/GM vaginal cream Apply topically to vaginal opening with fingertip.  Marland Kitchen metroNIDAZOLE (METROCREAM) 0.75 % cream APPLY TOPICALLY 2 TIMES DAILY TO FACE  . Multiple Vitamin (MULITIVITAMIN WITH MINERALS) TABS Take 1 tablet by mouth daily. Women's One-A-Day  . Multiple Vitamins-Minerals  (PRESERVISION AREDS PO) Take by mouth.  . Omega-3 Fatty Acids (FISH OIL) 1000 MG CAPS Take 1,000-2,000 mg by mouth See admin instructions. Take 1000 mg by mouth in the morning and take 2000 mg by mouth at bedtime  . [DISCONTINUED] atorvastatin (LIPITOR) 20 MG tablet TAKE ONE TABLET BY MOUTH EVERYDAY AT BEDTIME  . [DISCONTINUED] escitalopram (LEXAPRO) 10 MG tablet Take 1 tablet (10 mg total) by mouth daily.  . [DISCONTINUED] Multiple Vitamins-Minerals (ICAPS) CAPS Take 1 capsule by mouth daily.   Marland Kitchen atorvastatin (LIPITOR) 20 MG tablet Take 1 tab p.o. qhs.  . escitalopram (LEXAPRO) 10 MG tablet Take 0.5 tablets (5 mg total) by mouth daily.  . [DISCONTINUED] diclofenac (VOLTAREN) 75 MG EC tablet Take 1 tablet (75 mg total) by mouth 2 (two) times daily.  . [DISCONTINUED] HYDROcodone-acetaminophen (NORCO) 10-325 MG tablet Take one tablet by mouth at bedtime   No facility-administered encounter medications on file as of 03/14/2020.     Review of Systems  Constitutional: Negative for chills and fever.  HENT: Negative for congestion, rhinorrhea and sore throat.   Respiratory: Negative for cough, shortness of breath and wheezing.   Cardiovascular: Negative for chest pain and leg swelling.  Gastrointestinal: Negative for abdominal pain, diarrhea, nausea and vomiting.  Genitourinary: Negative for dysuria and frequency.       +vaginal itching.  Musculoskeletal: Negative for arthralgias and back pain.  Skin: Negative for rash.  Neurological: Negative for dizziness, weakness and headaches.  Psychiatric/Behavioral: Negative for dysphoric mood. The patient is not nervous/anxious.      Vitals  BP 112/74   Pulse 82   Temp (!) 97.2 F (36.2 C)   Ht 5\' 1"  (1.549 m)   Wt 140 lb (63.5 kg)   SpO2 98%   BMI 26.45 kg/m   Objective:   Physical Exam Vitals and nursing note reviewed.  Constitutional:      Appearance: Normal appearance.  HENT:     Head: Normocephalic and atraumatic.     Right Ear:  Tympanic membrane, ear canal and external ear normal.     Left Ear: Tympanic membrane, ear canal and external ear normal.     Nose: Nose normal.     Mouth/Throat:     Mouth: Mucous membranes are moist.     Pharynx: Oropharynx is clear.  Eyes:     Extraocular Movements: Extraocular movements intact.     Conjunctiva/sclera: Conjunctivae normal.     Pupils: Pupils are equal, round, and reactive to light.  Cardiovascular:     Rate and Rhythm: Normal rate and regular rhythm.     Pulses: Normal pulses.     Heart sounds: Normal heart sounds.  Pulmonary:     Effort: Pulmonary effort is normal.     Breath sounds: Normal breath sounds. No wheezing, rhonchi or rales.  Musculoskeletal:        General: Normal range of motion.     Right lower leg: No edema.     Left lower leg: No edema.  Skin:    General: Skin is warm and dry.     Findings: No lesion or rash.  Neurological:     General: No focal deficit present.     Mental Status: She is alert and oriented to person, place, and time.     Cranial Nerves: No cranial nerve deficit.  Psychiatric:        Mood and Affect: Mood normal.        Behavior: Behavior normal.        Thought Content: Thought content normal.        Judgment: Judgment normal.      Assessment and Plan   1. Generalized anxiety disorder - escitalopram (LEXAPRO) 10 MG tablet; Take 0.5 tablets (5 mg total) by mouth daily.  Dispense: 90 tablet; Refill: 1  2. Irritation of external female genitalia - Ambulatory referral to Gynecology  3. Discomfort of vulva - Ambulatory referral to Gynecology  4. Essential hypertension  5. Mixed hyperlipidemia   HM- Has mammo scheduled next week.  Anxiety- stable. Cont lexapro.  Irritation of urethra and labia- referral given to gyn. Pt to use HC cream prn.  htn- stable. Not currently on meds.  HLD- stable. Cont meds. TG have improved from 272 to 168. All other numbers at goal.

## 2020-03-21 ENCOUNTER — Encounter: Payer: Self-pay | Admitting: Family Medicine

## 2020-03-21 DIAGNOSIS — Z1231 Encounter for screening mammogram for malignant neoplasm of breast: Secondary | ICD-10-CM | POA: Diagnosis not present

## 2020-04-05 ENCOUNTER — Encounter: Payer: Self-pay | Admitting: Adult Health

## 2020-04-05 ENCOUNTER — Ambulatory Visit (INDEPENDENT_AMBULATORY_CARE_PROVIDER_SITE_OTHER): Payer: Medicare Other | Admitting: Adult Health

## 2020-04-05 ENCOUNTER — Other Ambulatory Visit: Payer: Self-pay

## 2020-04-05 VITALS — BP 125/71 | HR 69 | Ht 61.0 in | Wt 140.0 lb

## 2020-04-05 DIAGNOSIS — N952 Postmenopausal atrophic vaginitis: Secondary | ICD-10-CM | POA: Diagnosis not present

## 2020-04-05 DIAGNOSIS — N898 Other specified noninflammatory disorders of vagina: Secondary | ICD-10-CM | POA: Insufficient documentation

## 2020-04-05 DIAGNOSIS — K649 Unspecified hemorrhoids: Secondary | ICD-10-CM | POA: Diagnosis not present

## 2020-04-05 DIAGNOSIS — L900 Lichen sclerosus et atrophicus: Secondary | ICD-10-CM | POA: Diagnosis not present

## 2020-04-05 DIAGNOSIS — L292 Pruritus vulvae: Secondary | ICD-10-CM | POA: Diagnosis not present

## 2020-04-05 MED ORDER — HYDROCORTISONE ACE-PRAMOXINE 1-1 % EX CREA
1.0000 | TOPICAL_CREAM | Freq: Two times a day (BID) | CUTANEOUS | 1 refills | Status: AC
Start: 2020-04-05 — End: ?

## 2020-04-05 MED ORDER — CLOBETASOL PROPIONATE 0.05 % EX CREA: TOPICAL_CREAM | CUTANEOUS | 3 refills | Status: DC

## 2020-04-05 MED ORDER — ESTRADIOL 0.1 MG/GM VA CREA: TOPICAL_CREAM | VAGINAL | 2 refills | Status: DC

## 2020-04-05 NOTE — Progress Notes (Signed)
  Subjective:     Patient ID: Connie Colon, female   DOB: 1950-10-31, 70 y.o.   MRN: [id number]  HPI Garnell is a 70 year old white female,single, sp hysterectomy, in complaining of vulva irritation and vaginal itching. Has tried estrace cream without relief. PCP is Dr Lovena Le   Review of Systems +vaginal itching +vulva irritation Has hemorrhoid Not sexually active Reviewed past medical,surgical, social and family history. Reviewed medications and allergies.     Objective:   Physical Exam BP 125/71 (BP Location: Left Arm, Patient Position: Sitting, Cuff Size: Normal)   Pulse 69   Ht 5\' 1"  (1.549 m)   Wt 140 lb (63.5 kg)   BMI 26.45 kg/m    Skin warm and dry.Pelvic: external genitalia is normal in appearance, has some thicknes left inner labia and atrophy, vagina is pale with loss of moisture and rugae ,urethra has no lesions or masses noted, cervix and uterus are absent,adnexa: no masses or tenderness noted. Bladder is non tender and no masses felt. +external hemorrhoid  Examination chaperoned by Celene Squibb LPN.  AA is 0 Fall risk is low PHQ 9 score is 0 GAD 7 score is 0  Upstream - 04/05/20 1205      Pregnancy Intention Screening   Does the patient want to become pregnant in the next year? N/A    Does the patient's partner want to become pregnant in the next year? N/A    Would the patient like to discuss contraceptive options today? N/A      Contraception Wrap Up   Current Method --   hyst   End Method --   hyst   Contraception Counseling Provided No          Assessment:     1. Vaginal itching Use estrace bid in vagina   2. Vulvar itching Will try temovate  3. Vaginal atrophy Use estrace in vagina   4. Lichen sclerosus et atrophicus Will start temovate She is aware this is chronic Showed her pictures in [facility] ordered this encounter  Medications  . clobetasol cream (TEMOVATE) 0.05 %    Sig: Use bid x 2 weeks then 2-3 x  weekly to affected area    Dispense:  30 g    Refill:  3    Order Specific Question:   Supervising Provider    Answer:   Elonda Husky, LUTHER H [[id number]]  . estradiol (ESTRACE VAGINAL) 0.1 MG/GM vaginal cream    Sig: Apply 0.5 gm in vagina daily for 2 weeks then 2-3 x a weekly    Dispense:  42.5 g    Refill:  2    Order Specific Question:   Supervising Provider    Answer:   EURE, LUTHER H [[id number]]  . pramoxine-hydrocortisone (PROCTOCREAM-HC) 1-1 % rectal cream    Sig: Place 1 application rectally 2 (two) times daily.    Dispense:  30 g    Refill:  1    Order Specific Question:   Supervising Provider    Answer:   EURE, LUTHER H [[id number]]    5. Hemorrhoids, unspecified hemorrhoid type Try anusol HC or analpram HC       Plan:     Follow up in 4 weeks for recheck

## 2020-05-03 ENCOUNTER — Ambulatory Visit (INDEPENDENT_AMBULATORY_CARE_PROVIDER_SITE_OTHER): Payer: Medicare Other | Admitting: Adult Health

## 2020-05-03 ENCOUNTER — Other Ambulatory Visit: Payer: Self-pay

## 2020-05-03 ENCOUNTER — Encounter: Payer: Self-pay | Admitting: Adult Health

## 2020-05-03 VITALS — BP 129/74 | HR 66 | Ht 64.0 in | Wt 140.0 lb

## 2020-05-03 DIAGNOSIS — L900 Lichen sclerosus et atrophicus: Secondary | ICD-10-CM

## 2020-05-03 DIAGNOSIS — N952 Postmenopausal atrophic vaginitis: Secondary | ICD-10-CM

## 2020-05-03 NOTE — Progress Notes (Signed)
  Subjective:     Patient ID: Connie Colon, female   DOB: [birth year], 70 y.o.   MRN: [id number]  HPI Connie Colon is a 70 year old white female,single, sp hysterectomy back in follow up on starting temovate for lichen sclerosus and using estrace bid and feels much better. PCP is Dr Lovena Le.  Review of Systems  Has improved on itching and irritation Reviewed past medical,surgical, social and family history. Reviewed medications and allergies.     Objective:   Physical Exam BP 129/74 (BP Location: Left Arm, Patient Position: Sitting, Cuff Size: Normal)   Pulse 66   Ht 5\' 4"  (1.626 m)   Wt 140 lb (63.5 kg)   BMI 24.03 kg/m .Skin warm and dry.Pelvic: external genitalia is normal in appearance,not as thick left inner labia, skin still thin at clitoris and and slightly red at introitus , vagina: pale.  Examination chaperoned by [facility] LPN  Upstream - 68/03/21 1129      Pregnancy Intention Screening   Does the patient want to become pregnant in the next year? N/A    Does the patient's partner want to become pregnant in the next year? N/A    Would the patient like to discuss contraceptive options today? No      Contraception Wrap Up   Current Method --   hyst   End Method --   hyst   Contraception Counseling Provided No             Assessment:     1. Lichen sclerosus et atrophicus Continue temovate   2. Vaginal atrophy Continue estrace cream     Plan:     Follow up in 3 months or sooner of needed

## 2020-06-10 ENCOUNTER — Telehealth (INDEPENDENT_AMBULATORY_CARE_PROVIDER_SITE_OTHER): Payer: Medicare Other | Admitting: Family Medicine

## 2020-06-10 ENCOUNTER — Other Ambulatory Visit: Payer: Self-pay

## 2020-06-10 ENCOUNTER — Telehealth: Payer: Self-pay

## 2020-06-10 DIAGNOSIS — R062 Wheezing: Secondary | ICD-10-CM

## 2020-06-10 DIAGNOSIS — U071 COVID-19: Secondary | ICD-10-CM

## 2020-06-10 MED ORDER — NIRMATRELVIR/RITONAVIR (PAXLOVID)TABLET: 3.0000 | ORAL_TABLET | Freq: Two times a day (BID) | ORAL | 0 refills | Status: AC

## 2020-06-10 MED ORDER — ALBUTEROL SULFATE HFA 108 (90 BASE) MCG/ACT IN AERS: 2.0000 | INHALATION_SPRAY | RESPIRATORY_TRACT | 0 refills | Status: DC | PRN

## 2020-06-10 MED ORDER — NIRMATRELVIR/RITONAVIR (PAXLOVID)TABLET: 3.0000 | ORAL_TABLET | Freq: Two times a day (BID) | ORAL | 0 refills | Status: DC

## 2020-06-10 NOTE — Telephone Encounter (Signed)
Patient scheduled phone visit today with Dr Taylor 

## 2020-06-10 NOTE — Telephone Encounter (Signed)
Pt tested positive over the weekend with Covid and wants to know if there is something that can be called in for cough and congestion. Husband tested positive as well will send note on him. [facility] [address]   Pt call back [phone]

## 2020-06-10 NOTE — Progress Notes (Signed)
Patient ID: Connie Colon, female    DOB: [birth year], 70 y.o.   MRN: [id number]   Virtual Visit via Telephone Note  I connected with [name] on 06/10/20 at  4:10 PM EDT by telephone and verified that I am speaking with the correct person using two identifiers.  Location: Patient: home Provider: office   I discussed the limitations, risks, security and privacy concerns of performing an evaluation and management service by telephone and the availability of in person appointments. I also discussed with the patient that there may be a patient responsible charge related to this service. The patient expressed understanding and agreed to proceed.   Chief Complaint  Patient presents with  . Covid Positive   Subjective:    HPI  CC-congestion, cough, fatigue and fever. Home covid test done yesterday was positive. 2 days ago with symptoms. Her granddaughter is sick. "[place]" comes here and has been sick last week. Wondered if she had covid, because they were with her last week and she was sick with a fever.  Having coughing and feeling it's hard to get air out, sounds like wheezing sound.  Vomited 1x yesterday.  Headache resolved.  Feeling like the flu.  meds- cold meds otc, tylenol.    Medical History Minerva has a past medical history of Anxiety, Arthritis, Carpal tunnel syndrome of left wrist, GERD (gastroesophageal reflux disease), Hyperlipemia, Seasonal allergies, and Tinnitus.   Outpatient Encounter Medications as of 06/10/2020  Medication Sig  . albuterol (VENTOLIN HFA) 108 (90 Base) MCG/ACT inhaler Inhale 2 puffs into the lungs every 4 (four) hours as needed for wheezing or shortness of breath.  Marland Kitchen atorvastatin (LIPITOR) 20 MG tablet Take 1 tab p.o. qhs.  Marland Kitchen BIOTIN PO Take 1 tablet by mouth daily.   . chlorzoxazone (PARAFON) 500 MG tablet TAKE 1/2 TABLET BY MOUTH UP TO 4 TIMES PER DAY  . cholecalciferol (VITAMIN D) 1000 UNITS tablet Take 1,000 Units by mouth daily.   . clobetasol cream (TEMOVATE) 0.05 % Use bid x 2 weeks then 2-3 x weekly to affected area  . escitalopram (LEXAPRO) 10 MG tablet Take 0.5 tablets (5 mg total) by mouth daily.  Marland Kitchen estradiol (ESTRACE VAGINAL) 0.1 MG/GM vaginal cream Apply 0.5 gm in vagina daily for 2 weeks then 2-3 x a weekly  . metroNIDAZOLE (METROCREAM) 0.75 % cream APPLY TOPICALLY 2 TIMES DAILY TO FACE  . Multiple Vitamin (MULITIVITAMIN WITH MINERALS) TABS Take 1 tablet by mouth daily. Women's One-A-Day  . Multiple Vitamins-Minerals (PRESERVISION AREDS PO) Take by mouth.  . Omega-3 Fatty Acids (FISH OIL) 1000 MG CAPS Take 1,000-2,000 mg by mouth See admin instructions. Take 1000 mg by mouth in the morning and take 2000 mg by mouth at bedtime  . pramoxine-hydrocortisone (PROCTOCREAM-HC) 1-1 % rectal cream Place 1 application rectally 2 (two) times daily.  . [DISCONTINUED] nirmatrelvir/ritonavir EUA (PAXLOVID) TABS Take 3 tablets by mouth 2 (two) times daily for 5 days. Patient GFR is 81.  Take nirmatrelvir (150 mg) 2 tablet(s) twice daily for 5 days and ritonavir (100 mg) one tablet twice daily for 5 days.  . nirmatrelvir/ritonavir EUA (PAXLOVID) TABS Take 3 tablets by mouth 2 (two) times daily for 5 days. Patient GFR is 81.  Take nirmatrelvir (150 mg) 2 tablet(s) twice daily for 5 days and ritonavir (100 mg) one tablet twice daily for 5 days.   No facility-administered encounter medications on file as of 06/10/2020.     Review of Systems  Constitutional: Negative for  chills and fever.  HENT: Positive for congestion. Negative for ear pain, rhinorrhea, sinus pressure, sinus pain and sore throat.   Eyes: Negative for pain, discharge and itching.  Respiratory: Positive for cough and wheezing.   Gastrointestinal: Negative for constipation, diarrhea, nausea and vomiting.  Musculoskeletal: Positive for myalgias.  Neurological: Negative for dizziness and headaches.     Vitals There were no vitals taken for this  visit.  Objective:   Physical Exam No PE due to phone visit.  Assessment and Plan   1. COVID-19 - nirmatrelvir/ritonavir EUA (PAXLOVID) TABS; Take 3 tablets by mouth 2 (two) times daily for 5 days. Patient GFR is 81.  Take nirmatrelvir (150 mg) 2 tablet(s) twice daily for 5 days and ritonavir (100 mg) one tablet twice daily for 5 days.  Dispense: 30 tablet; Refill: 0  2. Wheezing - albuterol (VENTOLIN HFA) 108 (90 Base) MCG/ACT inhaler; Inhale 2 puffs into the lungs every 4 (four) hours as needed for wheezing or shortness of breath.  Dispense: 8 g; Refill: 0    Return if symptoms worsen or fail to improve.  Wheezing- [facility]   covid [facility]- paxlovid.- called in.  Already on vit D. Recommended vit c and zinc.  mucinex or delsym prn for coughing. Increase in fluids.  Advised not to take lipitor for 5 days while taking the paxlovid.  Pt voiced understanding.  Call or rto if not improving, if more sob, coughing, or weakness.pt in agreement with plan.  Follow Up Instructions:    I discussed the assessment and treatment plan with the patient. The patient was provided an opportunity to ask questions and all were answered. The patient agreed with the plan and demonstrated an understanding of the instructions.   The patient was advised to call back or seek an in-person evaluation if the symptoms worsen or if the condition fails to improve as anticipated.  I provided 12 minutes of non-face-to-face time during this encounter.

## 2020-06-12 ENCOUNTER — Other Ambulatory Visit: Payer: Self-pay | Admitting: *Deleted

## 2020-06-12 ENCOUNTER — Telehealth: Payer: Self-pay | Admitting: Family Medicine

## 2020-06-12 MED ORDER — ONDANSETRON 4 MG PO TBDP: 4.0000 mg | ORAL_TABLET | Freq: Three times a day (TID) | ORAL | 0 refills | Status: DC | PRN

## 2020-06-12 NOTE — Telephone Encounter (Signed)
Patient is requesting something for nausea was seen yesterday and prescribed something medication that making her sick on stomach to be called [facility]

## 2020-06-12 NOTE — Telephone Encounter (Signed)
Patient is experiencing nausea and vomiting , started on day 1 of covid medication vomited once, and on 3rd day also, nausea going on today . Asking for something to sent to pharmacy [place].

## 2020-06-12 NOTE — Telephone Encounter (Signed)
Pt notified. She wanted sent to [facility]. Resent

## 2020-06-18 ENCOUNTER — Telehealth: Payer: Self-pay

## 2020-06-18 NOTE — Telephone Encounter (Signed)
Discussed with pt. Pt verbalized understanding.  °

## 2020-06-18 NOTE — Telephone Encounter (Signed)
Connie Colon was diagnosed with Covid and wants to know how long she has to wait until she gets a booster   Pt call back [phone]

## 2020-06-18 NOTE — Telephone Encounter (Signed)
She will have natural immunity from having covid recently.   The current cdc recommendations is waiting 3 months to get the booster.   Dr. Lovena Le

## 2020-07-30 ENCOUNTER — Ambulatory Visit: Payer: Medicare Other | Admitting: Adult Health

## 2020-08-02 ENCOUNTER — Ambulatory Visit: Payer: Medicare Other | Admitting: Adult Health

## 2020-08-08 ENCOUNTER — Other Ambulatory Visit: Payer: Self-pay

## 2020-08-08 ENCOUNTER — Ambulatory Visit (INDEPENDENT_AMBULATORY_CARE_PROVIDER_SITE_OTHER): Payer: Medicare Other | Admitting: Adult Health

## 2020-08-08 ENCOUNTER — Encounter: Payer: Self-pay | Admitting: Adult Health

## 2020-08-08 VITALS — BP 116/68 | HR 66 | Ht 61.0 in | Wt 141.0 lb

## 2020-08-08 DIAGNOSIS — L900 Lichen sclerosus et atrophicus: Secondary | ICD-10-CM

## 2020-08-08 DIAGNOSIS — N952 Postmenopausal atrophic vaginitis: Secondary | ICD-10-CM

## 2020-08-08 NOTE — Progress Notes (Signed)
  Subjective:     Patient ID: Connie Colon, female   DOB: 1950-04-30, 70 y.o.   MRN: [id number]  HPI Adreanne is a 70 year old white female,single, sp hysterectomy back in follow up on using estrace vaginal cream and temovate and is doing good. PCP is Dr Lovena Le.   Review of Systems Feels much better, no irritation if uses cream 3 x a week Reviewed past medical,surgical, social and family history. Reviewed medications and allergies.     Objective:   Physical Exam BP 116/68 (BP Location: Right Arm, Patient Position: Sitting, Cuff Size: Normal)   Pulse 66   Ht 5\' 1"  (1.549 m)   Wt 141 lb (64 kg)   BMI 26.64 kg/m  Skin warm and dry.Pelvic: external genitalia is normal in appearance no lesions,tissue looking better, skin not as thin,vagina: pale pink,urethra has no lesions or masses noted,    Exam with out chaperone with verbal consent   Upstream - 08/08/20 1155       Pregnancy Intention Screening   Does the patient want to become pregnant in the next year? No    Does the patient's partner want to become pregnant in the next year? No    Would the patient like to discuss contraceptive options today? No      Contraception Wrap Up   Current Method --   hyst   End Method --   hyst   Contraception Counseling Provided No             Assessment:    1. Lichen sclerosus et atrophicus Continue temovate 2-3 x weekly, has refills  2. Vaginal atrophy Continue estrace vaginal cream 2-3 x weekly has refills     Plan:     Follow up in 1 year or sooner if needed

## 2020-09-04 DIAGNOSIS — Z6826 Body mass index (BMI) 26.0-26.9, adult: Secondary | ICD-10-CM | POA: Diagnosis not present

## 2020-09-04 DIAGNOSIS — R030 Elevated blood-pressure reading, without diagnosis of hypertension: Secondary | ICD-10-CM | POA: Diagnosis not present

## 2020-09-04 DIAGNOSIS — Z299 Encounter for prophylactic measures, unspecified: Secondary | ICD-10-CM | POA: Diagnosis not present

## 2020-09-04 DIAGNOSIS — E7800 Pure hypercholesterolemia, unspecified: Secondary | ICD-10-CM | POA: Diagnosis not present

## 2020-09-04 DIAGNOSIS — F419 Anxiety disorder, unspecified: Secondary | ICD-10-CM | POA: Diagnosis not present

## 2020-09-04 DIAGNOSIS — K2100 Gastro-esophageal reflux disease with esophagitis, without bleeding: Secondary | ICD-10-CM | POA: Diagnosis not present

## 2020-09-04 DIAGNOSIS — Z789 Other specified health status: Secondary | ICD-10-CM | POA: Diagnosis not present

## 2020-09-04 DIAGNOSIS — G250 Essential tremor: Secondary | ICD-10-CM | POA: Diagnosis not present

## 2020-10-04 DIAGNOSIS — E2839 Other primary ovarian failure: Secondary | ICD-10-CM | POA: Diagnosis not present

## 2020-11-08 DIAGNOSIS — Z23 Encounter for immunization: Secondary | ICD-10-CM | POA: Diagnosis not present

## 2020-11-22 DIAGNOSIS — Z23 Encounter for immunization: Secondary | ICD-10-CM | POA: Diagnosis not present

## 2020-11-28 DIAGNOSIS — H40003 Preglaucoma, unspecified, bilateral: Secondary | ICD-10-CM | POA: Diagnosis not present

## 2020-12-09 DIAGNOSIS — Z299 Encounter for prophylactic measures, unspecified: Secondary | ICD-10-CM | POA: Diagnosis not present

## 2020-12-09 DIAGNOSIS — Z7189 Other specified counseling: Secondary | ICD-10-CM | POA: Diagnosis not present

## 2020-12-09 DIAGNOSIS — Z1331 Encounter for screening for depression: Secondary | ICD-10-CM | POA: Diagnosis not present

## 2020-12-09 DIAGNOSIS — Z6826 Body mass index (BMI) 26.0-26.9, adult: Secondary | ICD-10-CM | POA: Diagnosis not present

## 2020-12-09 DIAGNOSIS — Z1339 Encounter for screening examination for other mental health and behavioral disorders: Secondary | ICD-10-CM | POA: Diagnosis not present

## 2020-12-09 DIAGNOSIS — Z79899 Other long term (current) drug therapy: Secondary | ICD-10-CM | POA: Diagnosis not present

## 2020-12-09 DIAGNOSIS — Z0000 Encounter for general adult medical examination without abnormal findings: Secondary | ICD-10-CM | POA: Diagnosis not present

## 2020-12-09 DIAGNOSIS — R5383 Other fatigue: Secondary | ICD-10-CM | POA: Diagnosis not present

## 2020-12-09 DIAGNOSIS — E7800 Pure hypercholesterolemia, unspecified: Secondary | ICD-10-CM | POA: Diagnosis not present

## 2020-12-09 DIAGNOSIS — E559 Vitamin D deficiency, unspecified: Secondary | ICD-10-CM | POA: Diagnosis not present

## 2021-01-26 HISTORY — PX: MOUTH SURGERY: SHX715

## 2021-02-28 ENCOUNTER — Other Ambulatory Visit: Payer: Self-pay | Admitting: Family Medicine

## 2021-04-07 ENCOUNTER — Other Ambulatory Visit (HOSPITAL_COMMUNITY): Payer: Self-pay | Admitting: Internal Medicine

## 2021-04-07 DIAGNOSIS — R10819 Abdominal tenderness, unspecified site: Secondary | ICD-10-CM | POA: Diagnosis not present

## 2021-04-07 DIAGNOSIS — E7800 Pure hypercholesterolemia, unspecified: Secondary | ICD-10-CM | POA: Diagnosis not present

## 2021-04-07 DIAGNOSIS — Z299 Encounter for prophylactic measures, unspecified: Secondary | ICD-10-CM | POA: Diagnosis not present

## 2021-04-07 DIAGNOSIS — R1011 Right upper quadrant pain: Secondary | ICD-10-CM

## 2021-04-07 DIAGNOSIS — Z6826 Body mass index (BMI) 26.0-26.9, adult: Secondary | ICD-10-CM | POA: Diagnosis not present

## 2021-04-07 DIAGNOSIS — Z789 Other specified health status: Secondary | ICD-10-CM | POA: Diagnosis not present

## 2021-04-07 DIAGNOSIS — K219 Gastro-esophageal reflux disease without esophagitis: Secondary | ICD-10-CM | POA: Diagnosis not present

## 2021-04-16 ENCOUNTER — Other Ambulatory Visit: Payer: Self-pay

## 2021-04-16 ENCOUNTER — Other Ambulatory Visit (HOSPITAL_COMMUNITY)
Admission: RE | Admit: 2021-04-16 | Discharge: 2021-04-16 | Disposition: A | Discharge status: Home / Self Care | Payer: Medicare Other | Source: Ambulatory Visit | Attending: Internal Medicine | Admitting: Internal Medicine

## 2021-04-16 ENCOUNTER — Ambulatory Visit (HOSPITAL_COMMUNITY)
Admission: RE | Admit: 2021-04-16 | Discharge: 2021-04-16 | Disposition: A | Discharge status: Home / Self Care | Payer: Medicare Other | Source: Ambulatory Visit | Attending: Internal Medicine | Admitting: Internal Medicine

## 2021-04-16 DIAGNOSIS — R1011 Right upper quadrant pain: Secondary | ICD-10-CM | POA: Insufficient documentation

## 2021-04-16 DIAGNOSIS — R5383 Other fatigue: Secondary | ICD-10-CM | POA: Insufficient documentation

## 2021-04-16 DIAGNOSIS — K760 Fatty (change of) liver, not elsewhere classified: Secondary | ICD-10-CM | POA: Diagnosis not present

## 2021-04-16 LAB — COMPREHENSIVE METABOLIC PANEL
ALT: 16 U/L (ref 0–44)
AST: 20 U/L (ref 15–41)
Albumin: 4 g/dL (ref 3.5–5.0)
Alkaline Phosphatase: 85 U/L (ref 38–126)
Anion gap: 7 (ref 5–15)
BUN: 16 mg/dL (ref 8–23)
CO2: 28 mmol/L (ref 22–32)
Calcium: 9 mg/dL (ref 8.9–10.3)
Chloride: 103 mmol/L (ref 98–111)
Creatinine, Ser: 0.73 mg/dL (ref 0.44–1.00)
GFR, Estimated: >60 mL/min (ref >60)
Glucose, Bld: 109 mg/dL — ABNORMAL HIGH (ref 70–99)
Potassium: 4.2 mmol/L (ref 3.5–5.1)
Sodium: 138 mmol/L (ref 135–145)
Total Bilirubin: 0.7 mg/dL (ref 0.3–1.2)
Total Protein: 7.1 g/dL (ref 6.5–8.1)

## 2021-04-16 LAB — LIPASE, BLOOD: Lipase: 32 U/L (ref 11–51)

## 2021-04-16 LAB — CBC
HCT: 43.6 % (ref 36.0–46.0)
Hemoglobin: 13.9 g/dL (ref 12.0–15.0)
MCH: 27.9 pg (ref 26.0–34.0)
MCHC: 31.9 g/dL (ref 30.0–36.0)
MCV: 87.6 fL (ref 80.0–100.0)
Platelets: 317 10*3/uL (ref 150–400)
RBC: 4.98 MIL/uL (ref 3.87–5.11)
RDW: 12.3 % (ref 11.5–15.5)
WBC: 8.5 10*3/uL (ref 4.0–10.5)
nRBC: 0 % (ref 0.0–0.2)

## 2021-04-22 DIAGNOSIS — E7800 Pure hypercholesterolemia, unspecified: Secondary | ICD-10-CM | POA: Diagnosis not present

## 2021-04-22 DIAGNOSIS — Z299 Encounter for prophylactic measures, unspecified: Secondary | ICD-10-CM | POA: Diagnosis not present

## 2021-04-22 DIAGNOSIS — K219 Gastro-esophageal reflux disease without esophagitis: Secondary | ICD-10-CM | POA: Diagnosis not present

## 2021-04-22 DIAGNOSIS — Z789 Other specified health status: Secondary | ICD-10-CM | POA: Diagnosis not present

## 2021-04-22 DIAGNOSIS — Z6826 Body mass index (BMI) 26.0-26.9, adult: Secondary | ICD-10-CM | POA: Diagnosis not present

## 2021-06-10 DIAGNOSIS — Z299 Encounter for prophylactic measures, unspecified: Secondary | ICD-10-CM | POA: Diagnosis not present

## 2021-06-10 DIAGNOSIS — E7800 Pure hypercholesterolemia, unspecified: Secondary | ICD-10-CM | POA: Diagnosis not present

## 2021-06-10 DIAGNOSIS — K2100 Gastro-esophageal reflux disease with esophagitis, without bleeding: Secondary | ICD-10-CM | POA: Diagnosis not present

## 2021-06-17 DIAGNOSIS — Z1231 Encounter for screening mammogram for malignant neoplasm of breast: Secondary | ICD-10-CM | POA: Diagnosis not present

## 2021-09-04 ENCOUNTER — Ambulatory Visit: Payer: Medicare Other | Admitting: Adult Health

## 2021-09-09 DIAGNOSIS — E7800 Pure hypercholesterolemia, unspecified: Secondary | ICD-10-CM | POA: Diagnosis not present

## 2021-09-09 DIAGNOSIS — Z6825 Body mass index (BMI) 25.0-25.9, adult: Secondary | ICD-10-CM | POA: Diagnosis not present

## 2021-09-09 DIAGNOSIS — M26649 Arthritis of unspecified temporomandibular joint: Secondary | ICD-10-CM | POA: Diagnosis not present

## 2021-09-09 DIAGNOSIS — Z789 Other specified health status: Secondary | ICD-10-CM | POA: Diagnosis not present

## 2021-09-09 DIAGNOSIS — Z299 Encounter for prophylactic measures, unspecified: Secondary | ICD-10-CM | POA: Diagnosis not present

## 2021-09-09 DIAGNOSIS — G500 Trigeminal neuralgia: Secondary | ICD-10-CM | POA: Diagnosis not present

## 2021-09-18 DIAGNOSIS — L821 Other seborrheic keratosis: Secondary | ICD-10-CM | POA: Diagnosis not present

## 2021-09-18 DIAGNOSIS — D225 Melanocytic nevi of trunk: Secondary | ICD-10-CM | POA: Diagnosis not present

## 2021-09-18 DIAGNOSIS — L820 Inflamed seborrheic keratosis: Secondary | ICD-10-CM | POA: Diagnosis not present

## 2021-09-18 DIAGNOSIS — Z1283 Encounter for screening for malignant neoplasm of skin: Secondary | ICD-10-CM | POA: Diagnosis not present

## 2021-09-18 DIAGNOSIS — L298 Other pruritus: Secondary | ICD-10-CM | POA: Diagnosis not present

## 2021-09-23 DIAGNOSIS — Z6825 Body mass index (BMI) 25.0-25.9, adult: Secondary | ICD-10-CM | POA: Diagnosis not present

## 2021-09-23 DIAGNOSIS — Z299 Encounter for prophylactic measures, unspecified: Secondary | ICD-10-CM | POA: Diagnosis not present

## 2021-09-23 DIAGNOSIS — G500 Trigeminal neuralgia: Secondary | ICD-10-CM | POA: Diagnosis not present

## 2021-09-23 DIAGNOSIS — Z713 Dietary counseling and surveillance: Secondary | ICD-10-CM | POA: Diagnosis not present

## 2021-09-23 DIAGNOSIS — K219 Gastro-esophageal reflux disease without esophagitis: Secondary | ICD-10-CM | POA: Diagnosis not present

## 2021-10-10 DIAGNOSIS — Z23 Encounter for immunization: Secondary | ICD-10-CM | POA: Diagnosis not present

## 2021-10-21 ENCOUNTER — Ambulatory Visit (INDEPENDENT_AMBULATORY_CARE_PROVIDER_SITE_OTHER): Payer: Medicare Other | Admitting: Adult Health

## 2021-10-21 ENCOUNTER — Encounter: Payer: Self-pay | Admitting: Adult Health

## 2021-10-21 VITALS — BP 110/58 | HR 70 | Ht 61.0 in | Wt 136.0 lb

## 2021-10-21 DIAGNOSIS — L900 Lichen sclerosus et atrophicus: Secondary | ICD-10-CM

## 2021-10-21 DIAGNOSIS — N952 Postmenopausal atrophic vaginitis: Secondary | ICD-10-CM | POA: Diagnosis not present

## 2021-10-21 DIAGNOSIS — L292 Pruritus vulvae: Secondary | ICD-10-CM | POA: Diagnosis not present

## 2021-10-21 DIAGNOSIS — N898 Other specified noninflammatory disorders of vagina: Secondary | ICD-10-CM

## 2021-10-21 MED ORDER — ESTRADIOL 0.1 MG/GM VA CREA: TOPICAL_CREAM | VAGINAL | 2 refills | Status: DC

## 2021-10-21 NOTE — Progress Notes (Signed)
  Subjective:     Patient ID: Connie Colon, female   DOB: [birth year], 71 y.o.   MRN: [id number]  HPI Connie Colon is a 71 year old white female,married, sp hysterectomy in for follow up on LSA and vaginal atrophy, has had some itching, but temovate helps. She said she took antibiotic for 20 days for tooth and felt better.  PCP is Dr Manuella Ghazi.   Review of Systems Has some vaginal and vulva itching at times Reviewed past medical,surgical, social and family history. Reviewed medications and allergies.     Objective:   Physical Exam BP (!) 110/58 (BP Location: Left Arm, Patient Position: Sitting, Cuff Size: Normal)   Pulse 70   Ht '5\' 1"'$  (1.549 m)   Wt 136 lb (61.7 kg)   BMI 25.70 kg/m     Skin warm and dry.Pelvic: external genitalia is normal in appearance no lesions,tissue thin at clitoris area, vagina:pale and atrophic, urethra has no lesions or masses noted, cervix and uterus area absent, adnexa: no masses or tenderness noted. Bladder is non tender and no masses  Fall risk is low  Upstream - 10/21/21 1119       Pregnancy Intention Screening   Does the patient want to become pregnant in the next year? N/A    Does the patient's partner want to become pregnant in the next year? N/A    Would the patient like to discuss contraceptive options today? N/A      Contraception Wrap Up   Current Method Female Sterilization   hyst   End Method Female Sterilization   hyst   Contraception Counseling Provided No            Examination chaperoned by Levy Pupa LPN  Assessment:     1. Lichen sclerosus et atrophicus Continue temovate, 2-3 x weekly has refills she says   2. Vaginal atrophy Continue using Estrace 0.5 gm  2-3 x weekly  Meds ordered this encounter  Medications   estradiol (ESTRACE VAGINAL) 0.1 MG/GM vaginal cream    Sig: Apply 0.5 gm in vagina daily for 2 weeks then 2-3 x a weekly    Dispense:  42.5 g    Refill:  2    Order Specific Question:   Supervising Provider     Answer:   EURE, LUTHER H [[id number]]     3. Vulvar itching Tempovate helps   4. Vaginal itching    Plan:     Follow up in 1 year or sooner if needed

## 2021-12-03 DIAGNOSIS — H04123 Dry eye syndrome of bilateral lacrimal glands: Secondary | ICD-10-CM | POA: Diagnosis not present

## 2021-12-10 DIAGNOSIS — E559 Vitamin D deficiency, unspecified: Secondary | ICD-10-CM | POA: Diagnosis not present

## 2021-12-10 DIAGNOSIS — Z299 Encounter for prophylactic measures, unspecified: Secondary | ICD-10-CM | POA: Diagnosis not present

## 2021-12-10 DIAGNOSIS — Z6826 Body mass index (BMI) 26.0-26.9, adult: Secondary | ICD-10-CM | POA: Diagnosis not present

## 2021-12-10 DIAGNOSIS — Z0000 Encounter for general adult medical examination without abnormal findings: Secondary | ICD-10-CM | POA: Diagnosis not present

## 2021-12-10 DIAGNOSIS — K2100 Gastro-esophageal reflux disease with esophagitis, without bleeding: Secondary | ICD-10-CM | POA: Diagnosis not present

## 2021-12-10 DIAGNOSIS — Z79899 Other long term (current) drug therapy: Secondary | ICD-10-CM | POA: Diagnosis not present

## 2021-12-10 DIAGNOSIS — Z1339 Encounter for screening examination for other mental health and behavioral disorders: Secondary | ICD-10-CM | POA: Diagnosis not present

## 2021-12-10 DIAGNOSIS — Z1331 Encounter for screening for depression: Secondary | ICD-10-CM | POA: Diagnosis not present

## 2021-12-10 DIAGNOSIS — R5383 Other fatigue: Secondary | ICD-10-CM | POA: Diagnosis not present

## 2021-12-10 DIAGNOSIS — Z7189 Other specified counseling: Secondary | ICD-10-CM | POA: Diagnosis not present

## 2021-12-10 DIAGNOSIS — E7800 Pure hypercholesterolemia, unspecified: Secondary | ICD-10-CM | POA: Diagnosis not present

## 2021-12-17 DIAGNOSIS — Z713 Dietary counseling and surveillance: Secondary | ICD-10-CM | POA: Diagnosis not present

## 2021-12-17 DIAGNOSIS — E7800 Pure hypercholesterolemia, unspecified: Secondary | ICD-10-CM | POA: Diagnosis not present

## 2021-12-17 DIAGNOSIS — Z6826 Body mass index (BMI) 26.0-26.9, adult: Secondary | ICD-10-CM | POA: Diagnosis not present

## 2021-12-17 DIAGNOSIS — Z299 Encounter for prophylactic measures, unspecified: Secondary | ICD-10-CM | POA: Diagnosis not present

## 2021-12-31 DIAGNOSIS — Z23 Encounter for immunization: Secondary | ICD-10-CM | POA: Diagnosis not present

## 2022-02-10 DIAGNOSIS — E7800 Pure hypercholesterolemia, unspecified: Secondary | ICD-10-CM | POA: Diagnosis not present

## 2022-02-10 DIAGNOSIS — K219 Gastro-esophageal reflux disease without esophagitis: Secondary | ICD-10-CM | POA: Diagnosis not present

## 2022-02-10 DIAGNOSIS — Z299 Encounter for prophylactic measures, unspecified: Secondary | ICD-10-CM | POA: Diagnosis not present

## 2022-06-25 DIAGNOSIS — Z1231 Encounter for screening mammogram for malignant neoplasm of breast: Secondary | ICD-10-CM | POA: Diagnosis not present

## 2022-07-06 DIAGNOSIS — R928 Other abnormal and inconclusive findings on diagnostic imaging of breast: Secondary | ICD-10-CM | POA: Diagnosis not present

## 2022-07-06 DIAGNOSIS — R92322 Mammographic fibroglandular density, left breast: Secondary | ICD-10-CM | POA: Diagnosis not present

## 2022-07-06 DIAGNOSIS — N6489 Other specified disorders of breast: Secondary | ICD-10-CM | POA: Diagnosis not present

## 2022-08-20 DIAGNOSIS — R12 Heartburn: Secondary | ICD-10-CM | POA: Diagnosis not present

## 2022-08-20 DIAGNOSIS — Z299 Encounter for prophylactic measures, unspecified: Secondary | ICD-10-CM | POA: Diagnosis not present

## 2022-08-20 DIAGNOSIS — K219 Gastro-esophageal reflux disease without esophagitis: Secondary | ICD-10-CM | POA: Diagnosis not present

## 2022-09-22 DIAGNOSIS — R12 Heartburn: Secondary | ICD-10-CM | POA: Diagnosis not present

## 2022-09-22 DIAGNOSIS — Z299 Encounter for prophylactic measures, unspecified: Secondary | ICD-10-CM | POA: Diagnosis not present

## 2022-09-22 DIAGNOSIS — K219 Gastro-esophageal reflux disease without esophagitis: Secondary | ICD-10-CM | POA: Diagnosis not present

## 2022-11-04 DIAGNOSIS — Z23 Encounter for immunization: Secondary | ICD-10-CM | POA: Diagnosis not present

## 2022-11-04 DIAGNOSIS — K2100 Gastro-esophageal reflux disease with esophagitis, without bleeding: Secondary | ICD-10-CM | POA: Diagnosis not present

## 2022-11-04 DIAGNOSIS — K219 Gastro-esophageal reflux disease without esophagitis: Secondary | ICD-10-CM | POA: Diagnosis not present

## 2022-11-04 DIAGNOSIS — Z299 Encounter for prophylactic measures, unspecified: Secondary | ICD-10-CM | POA: Diagnosis not present

## 2022-11-17 ENCOUNTER — Ambulatory Visit (INDEPENDENT_AMBULATORY_CARE_PROVIDER_SITE_OTHER): Payer: Medicare Other | Admitting: Adult Health

## 2022-11-17 ENCOUNTER — Encounter: Payer: Self-pay | Admitting: Adult Health

## 2022-11-17 VITALS — BP 121/69 | HR 70 | Ht 61.0 in | Wt 139.0 lb

## 2022-11-17 DIAGNOSIS — L292 Pruritus vulvae: Secondary | ICD-10-CM | POA: Diagnosis not present

## 2022-11-17 DIAGNOSIS — L900 Lichen sclerosus et atrophicus: Secondary | ICD-10-CM

## 2022-11-17 DIAGNOSIS — Z1330 Encounter for screening examination for mental health and behavioral disorders, unspecified: Secondary | ICD-10-CM | POA: Diagnosis not present

## 2022-11-17 DIAGNOSIS — N952 Postmenopausal atrophic vaginitis: Secondary | ICD-10-CM

## 2022-11-17 MED ORDER — CLOBETASOL PROPIONATE 0.05 % EX CREA: TOPICAL_CREAM | CUTANEOUS | 3 refills | Status: DC

## 2022-11-17 NOTE — Progress Notes (Signed)
Patient ID: Connie Colon, female   DOB: 06-Mar-1950, 72 y.o.   MRN: [id number] History of Present Illness: Janelle is a 72 year old white female, married, sp hysterectomy in for vaginal exam, has LSA and vaginal atrophy, has some itching at times.  PCP is Dr Sherryll Burger.    Current Medications, Allergies, Past Medical History, Past Surgical History, Family History and Social History were reviewed in [employer] record.     Review of Systems: Has some itching still at times Not having sex     Physical Exam:BP 121/69 (BP Location: Left Arm, Patient Position: Sitting, Cuff Size: Normal)   Pulse 70   Ht 5\' 1"  (1.549 m)   Wt 139 lb (63 kg)   BMI 26.26 kg/m   General:  Well developed, well nourished, no acute distress Skin:  Warm and dry Neck:  Midline trachea, normal thyroid, good ROM, no lymphadenopathy,no carotid bruits heard Lungs; Clear to auscultation bilaterally Cardiovascular: Regular rate and rhythm Pelvic:  External genitalia is normal in appearance, has thin skin about clitoris area, skin is pale.  The vagina is pale, and atrophic. Urethra has no lesions or masses. The cervix and uterus are absent. No adnexal masses or tenderness noted.Bladder is non tender, no masses felt. Psych:  No mood changes, alert and cooperative,seems happy AA is 0 Fall risk is low    11/17/2022   11:25 AM 04/05/2020   12:04 PM 03/14/2020   11:22 AM  Depression screen PHQ 2/9  Decreased Interest 0 0 0  Down, Depressed, Hopeless 0 0 0  PHQ - 2 Score 0 0 0  Altered sleeping 0 0   Tired, decreased energy 0 0   Change in appetite 0 0   Feeling bad or failure about yourself  0 0   Trouble concentrating 0 0   Moving slowly or fidgety/restless 0 0   Suicidal thoughts 0 0   PHQ-9 Score 0 0        11/17/2022   11:25 AM 04/05/2020   12:05 PM 05/10/2017   10:38 AM  GAD 7 : Generalized Anxiety Score  Nervous, Anxious, on Edge 0 0 0  Control/stop worrying 0 0 0  Worry too much -  different things 0 0 0  Trouble relaxing 0 0 0  Restless 0 0 2  Easily annoyed or irritable 0 0 0  Afraid - awful might happen 0 0 0  Total GAD 7 Score 0 0 2  Anxiety Difficulty   Not difficult at all      Upstream - 11/17/22 1124       Pregnancy Intention Screening   Does the patient want to become pregnant in the next year? N/A    Does the patient's partner want to become pregnant in the next year? N/A    Would the patient like to discuss contraceptive options today? N/A      Contraception Wrap Up   Current Method Female Sterilization   hyst   End Method Female Sterilization   hyst   Contraception Counseling Provided No             Examination chaperoned by Malachy Mood LPN  Impression and Plan:  1. Lichen sclerosus et atrophicus Skin is thin about clitoris area Continue temovate 2-3 x weekly Meds ordered this encounter  Medications   clobetasol cream (TEMOVATE) 0.05 %    Sig: Use bid x 2 weeks then 2-3 x weekly to affected area    Dispense:  30  g    Refill:  3    Order Specific Question:   Supervising Provider    Answer:   Despina Hidden, LUTHER H [[id number]]     2. Vaginal atrophy +atrophy, continue estrace vaginal cream Does not need refill  3. Vulvar itching Still has some itching at times  Continue temovate and can use estrace on outside too.   Follow up in 6 months for recheck or sooner if needed

## 2022-11-29 IMAGING — US US ABDOMEN COMPLETE
1 series · 14 of 25 positions shown · non-contrast
Comparison: None.

CLINICAL DATA: Right upper quadrant pain x1 month.

EXAM:
ABDOMEN ULTRASOUND COMPLETE

[Series 1: us abdomen complete · 0.16mm/px · 14 of 196 slices shown]
[im 1/196]
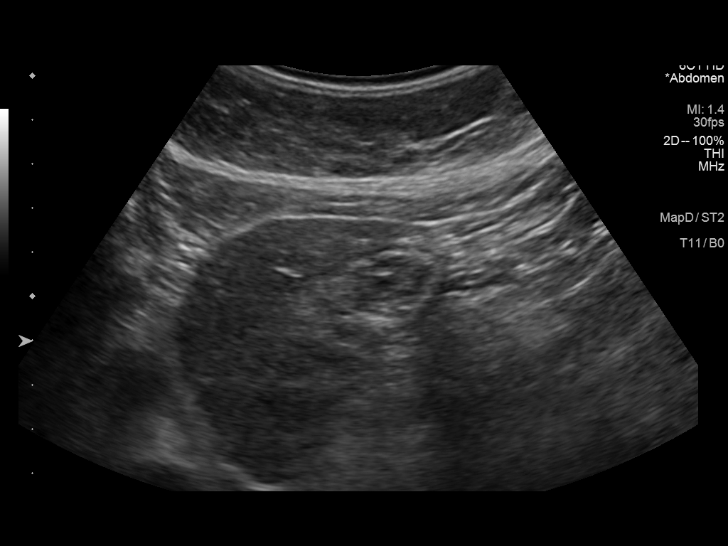
[im 17/196]
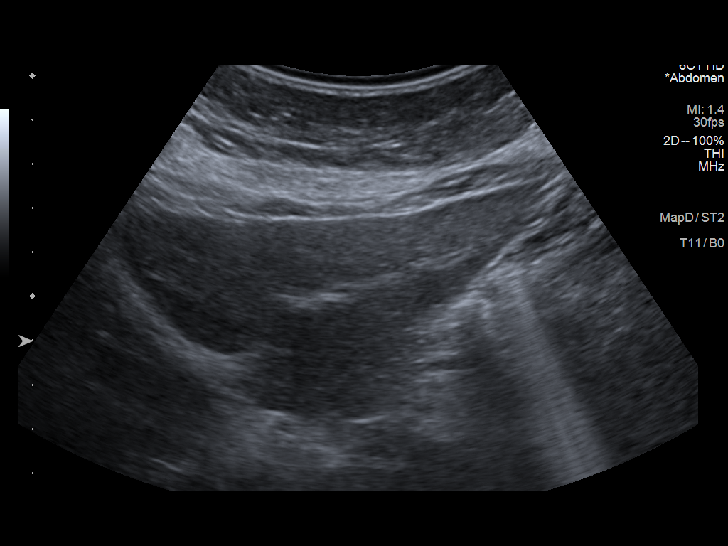
[im 33/196]
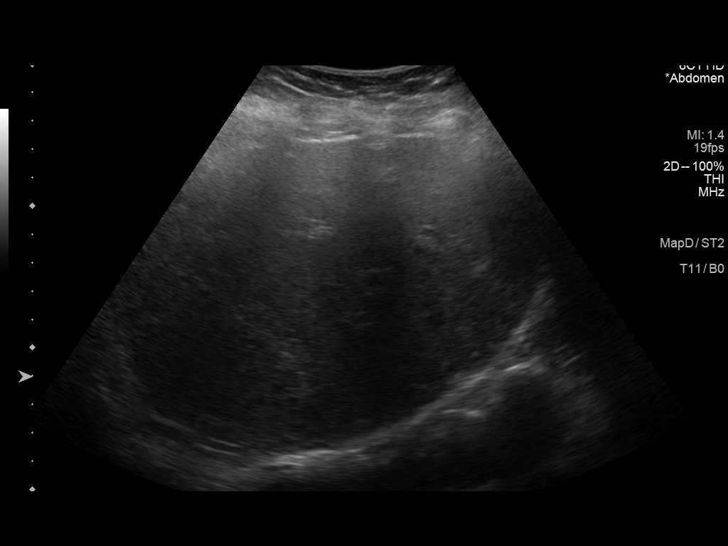
[im 49/196]
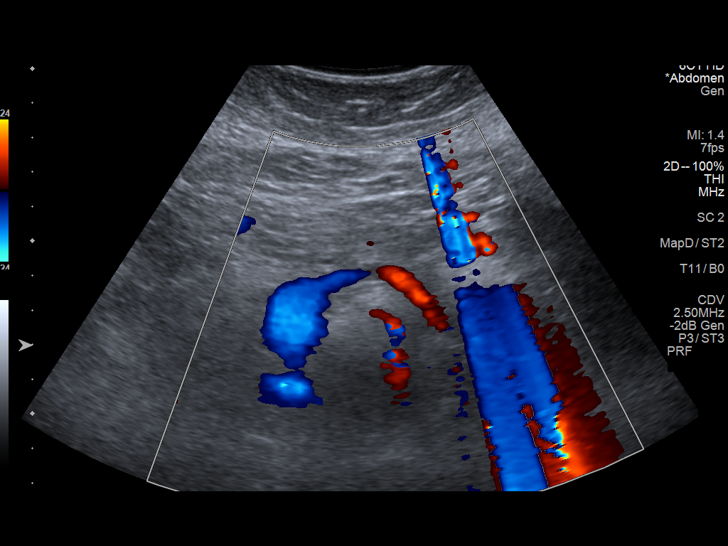
[im 66/196]
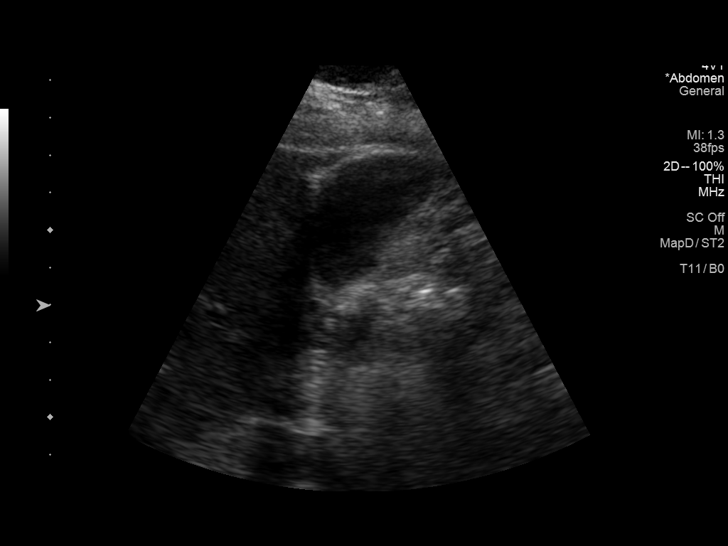
[im 74/196]
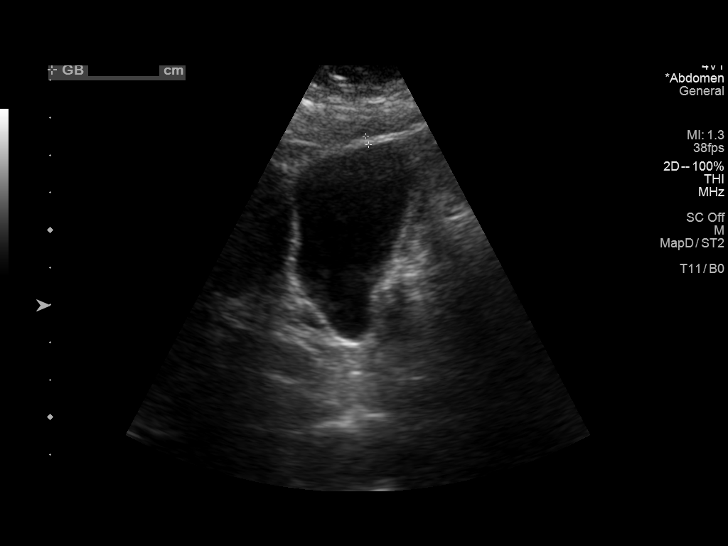
[im 90/196]
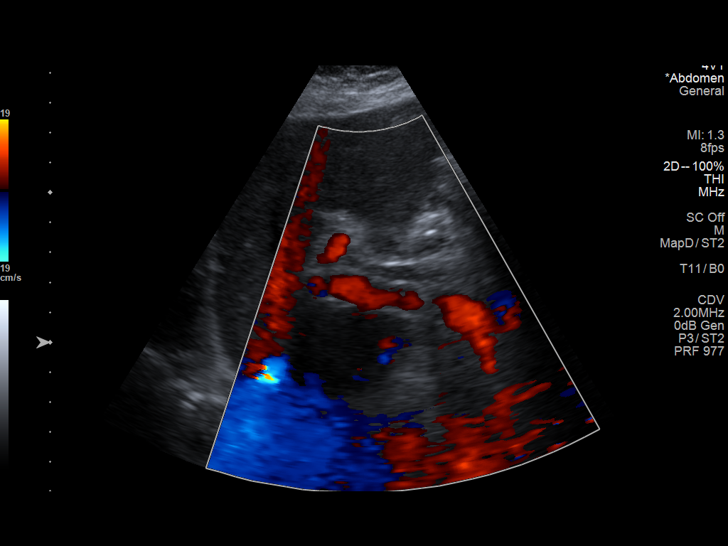
[im 106/196]
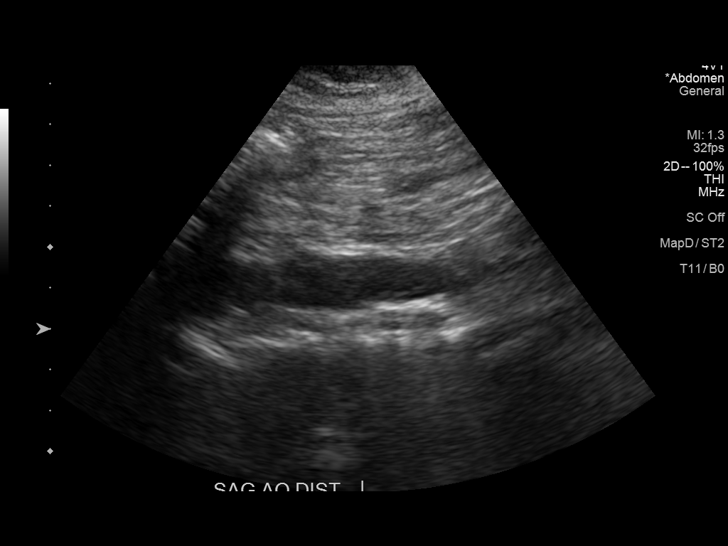
[im 122/196]
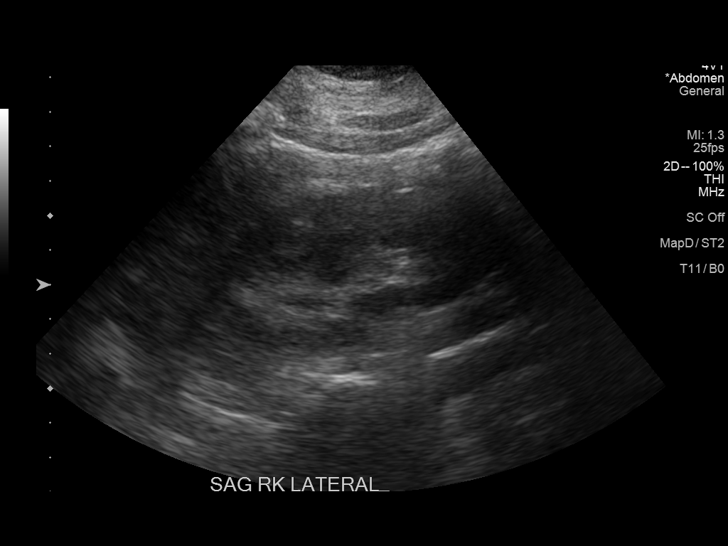
[im 131/196]
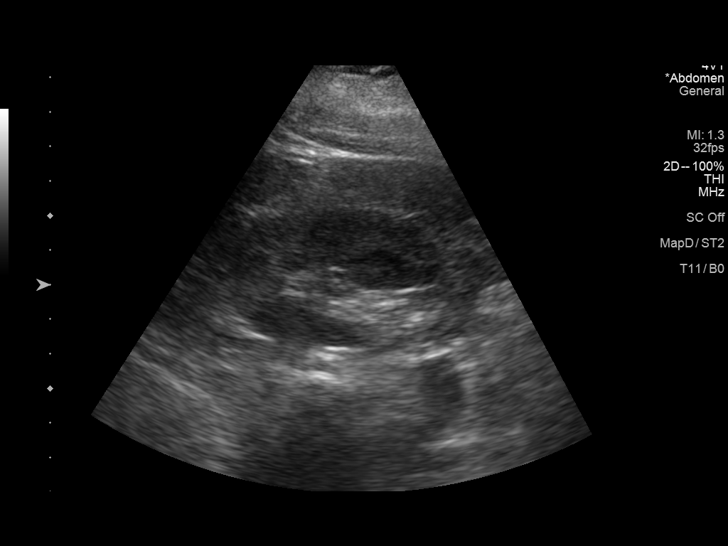
[im 147/196]
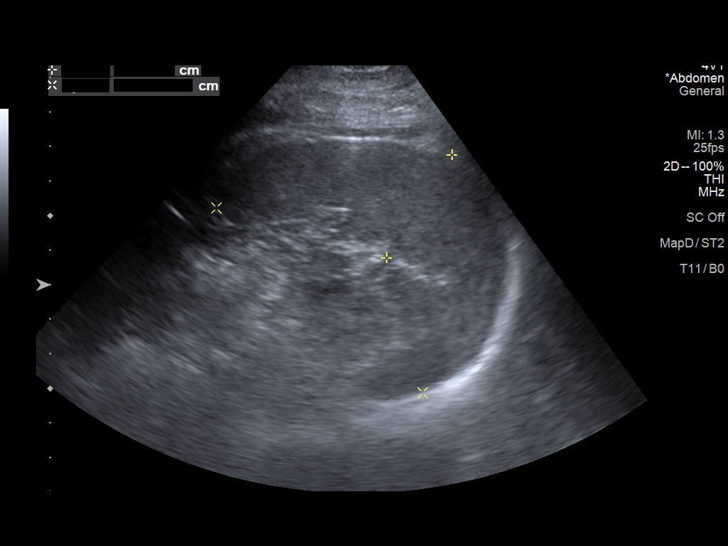
[im 163/196]
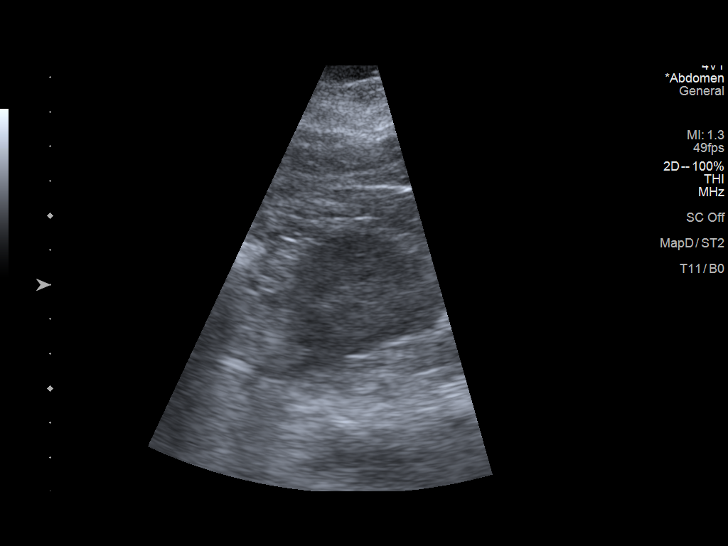
[im 179/196]
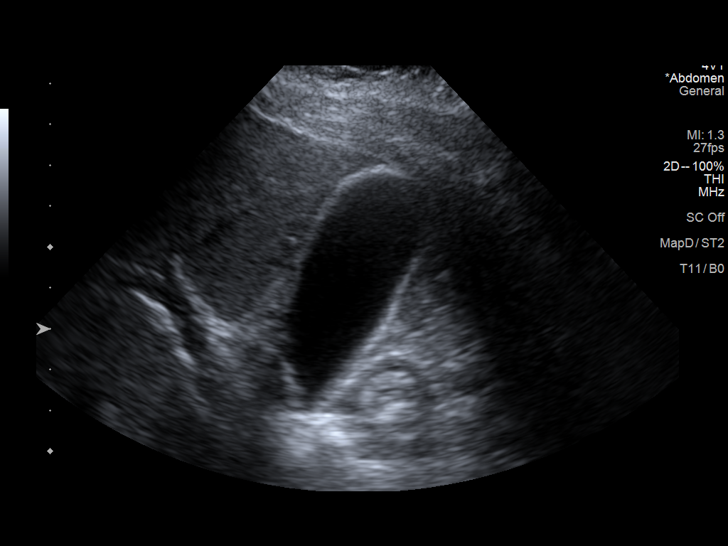
[im 196/196]
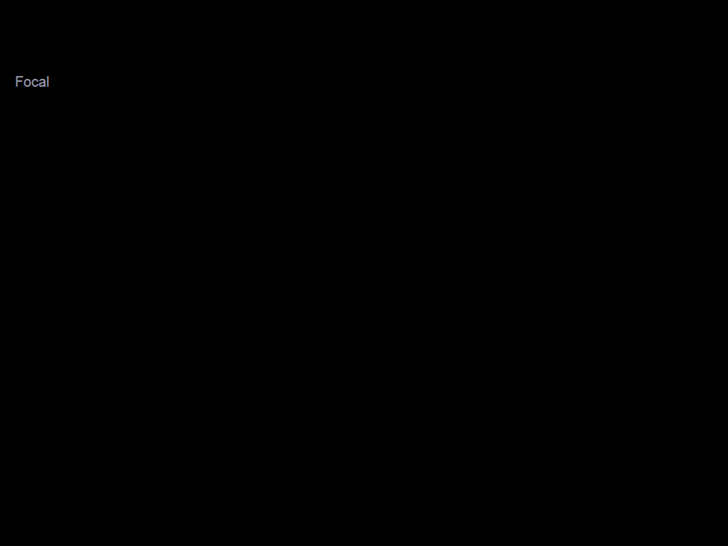

[14 of 25 positions shown; findings below may reference images not displayed]

FINDINGS: Gallbladder: No gallstones or wall thickening visualized (2.4 mm).
No sonographic Murphy sign noted by sonographer.

Common bile duct: Diameter: 2.9 mm

Liver: No focal lesion identified. Diffusely increased echogenicity
of the liver parenchyma is note. Portal vein is patent on color
Doppler imaging with normal direction of blood flow towards the
liver.

IVC: No abnormality visualized.

Pancreas: Poorly visualized secondary to overlying bowel gas.

Spleen: Size (7.8 cm) and appearance within normal limits.

Right Kidney: Length: 10.3 cm. Echogenicity within normal limits. No
mass or hydronephrosis visualized.

Left Kidney: Length: 10.3 cm. Echogenicity within normal limits. No
mass or hydronephrosis visualized.

Abdominal aorta: No aneurysm visualized (2.2 cm in AP diameter).

Other findings: None.
IMPRESSION: Hepatic steatosis without focal liver lesions.

## 2022-11-30 DIAGNOSIS — Z23 Encounter for immunization: Secondary | ICD-10-CM | POA: Diagnosis not present

## 2022-12-09 DIAGNOSIS — H04123 Dry eye syndrome of bilateral lacrimal glands: Secondary | ICD-10-CM | POA: Diagnosis not present

## 2022-12-11 DIAGNOSIS — Z299 Encounter for prophylactic measures, unspecified: Secondary | ICD-10-CM | POA: Diagnosis not present

## 2022-12-11 DIAGNOSIS — R5383 Other fatigue: Secondary | ICD-10-CM | POA: Diagnosis not present

## 2022-12-11 DIAGNOSIS — Z79899 Other long term (current) drug therapy: Secondary | ICD-10-CM | POA: Diagnosis not present

## 2022-12-11 DIAGNOSIS — Z0000 Encounter for general adult medical examination without abnormal findings: Secondary | ICD-10-CM | POA: Diagnosis not present

## 2022-12-11 DIAGNOSIS — Z1331 Encounter for screening for depression: Secondary | ICD-10-CM | POA: Diagnosis not present

## 2022-12-11 DIAGNOSIS — Z7189 Other specified counseling: Secondary | ICD-10-CM | POA: Diagnosis not present

## 2022-12-11 DIAGNOSIS — E559 Vitamin D deficiency, unspecified: Secondary | ICD-10-CM | POA: Diagnosis not present

## 2022-12-11 DIAGNOSIS — Z1339 Encounter for screening examination for other mental health and behavioral disorders: Secondary | ICD-10-CM | POA: Diagnosis not present

## 2022-12-11 DIAGNOSIS — E7800 Pure hypercholesterolemia, unspecified: Secondary | ICD-10-CM | POA: Diagnosis not present

## 2023-01-12 DIAGNOSIS — E2839 Other primary ovarian failure: Secondary | ICD-10-CM | POA: Diagnosis not present

## 2023-07-07 DIAGNOSIS — Z299 Encounter for prophylactic measures, unspecified: Secondary | ICD-10-CM | POA: Diagnosis not present

## 2023-07-07 DIAGNOSIS — K219 Gastro-esophageal reflux disease without esophagitis: Secondary | ICD-10-CM | POA: Diagnosis not present

## 2023-07-07 DIAGNOSIS — R079 Chest pain, unspecified: Secondary | ICD-10-CM | POA: Diagnosis not present

## 2023-07-07 DIAGNOSIS — H6990 Unspecified Eustachian tube disorder, unspecified ear: Secondary | ICD-10-CM | POA: Diagnosis not present

## 2023-07-07 DIAGNOSIS — L719 Rosacea, unspecified: Secondary | ICD-10-CM | POA: Diagnosis not present

## 2023-08-03 DIAGNOSIS — Z1231 Encounter for screening mammogram for malignant neoplasm of breast: Secondary | ICD-10-CM | POA: Diagnosis not present

## 2023-10-21 DIAGNOSIS — Z23 Encounter for immunization: Secondary | ICD-10-CM | POA: Diagnosis not present

## 2023-12-08 ENCOUNTER — Encounter: Payer: Self-pay | Admitting: Adult Health

## 2023-12-08 ENCOUNTER — Ambulatory Visit: Admitting: Adult Health

## 2023-12-08 VITALS — BP 122/87 | HR 71 | Ht 61.0 in | Wt 143.0 lb

## 2023-12-08 DIAGNOSIS — N952 Postmenopausal atrophic vaginitis: Secondary | ICD-10-CM | POA: Diagnosis not present

## 2023-12-08 DIAGNOSIS — Z01419 Encounter for gynecological examination (general) (routine) without abnormal findings: Secondary | ICD-10-CM | POA: Diagnosis not present

## 2023-12-08 DIAGNOSIS — Z90710 Acquired absence of both cervix and uterus: Secondary | ICD-10-CM

## 2023-12-08 DIAGNOSIS — L900 Lichen sclerosus et atrophicus: Secondary | ICD-10-CM

## 2023-12-08 MED ORDER — CLOBETASOL PROPIONATE 0.05 % EX CREA: TOPICAL_CREAM | CUTANEOUS | 3 refills | Status: AC

## 2023-12-08 MED ORDER — ESTRADIOL 0.01 % VA CREA: TOPICAL_CREAM | VAGINAL | 3 refills | Status: AC

## 2023-12-08 NOTE — Progress Notes (Signed)
 Patient ID: Connie Colon, female   DOB: January 31, 1950, 73 y.o.   MRN: [id number] History of Present Illness: Connie Colon is a 73 year old white female, married, sp hysterectomy on for a well woman gyn exam. She has had flu shot, and will get COVID vaccine.   PCP is Dr Maree Current Medications, Allergies, Past Medical History, Past Surgical History, Family History and Social History were reviewed in [employer] [facility] electronic medical record.     Review of Systems: Patient denies any headaches, hearing loss, fatigue, blurred vision, shortness of breath, chest pain, abdominal pain, problems with bowel movements, urination, or intercourse.(Not having sex). No joint pain(knees hurt sometimes) or mood swings.  Still will get some itching in vaginal area   Physical Exam:BP 122/87 (BP Location: Right Arm, Patient Position: Sitting, Cuff Size: Normal)   Pulse 71   Ht 5' 1 (1.549 m)   Wt 143 lb (64.9 kg)   BMI 27.02 kg/m   General:  Well developed, well nourished, no acute distress Skin:  Warm and dry Neck:  Midline trachea, normal thyroid , good ROM, no lymphadenopathy, no carotid bruits heard  Lungs; Clear to auscultation bilaterally Breast:  No dominant palpable mass, retraction, or nipple discharge Cardiovascular: Regular rate and rhythm Abdomen:  Soft, non tender, no hepatosplenomegaly Pelvic:  External genitalia is normal in appearance, tissue is thin near clit area.  The vagina is pale and atrophic. Urethra has no lesions or masses. The cervix and uterus are absent.  No adnexal masses or tenderness noted.Bladder is non tender, no masses felt. Rectal: Deferred Extremities/musculoskeletal:  No swelling or varicosities noted, no clubbing or cyanosis Psych:  No mood changes, alert and cooperative,seems happy AA is 0 Fall risk is low    12/08/2023   10:30 AM 11/17/2022   11:25 AM 04/05/2020   12:04 PM  Depression screen PHQ 2/9  Decreased Interest 0 0 0  Down, Depressed, Hopeless 0 0 0  PHQ  - 2 Score 0 0 0  Altered sleeping 0 0 0  Tired, decreased energy 0 0 0  Change in appetite 0 0 0  Feeling bad or failure about yourself  0 0 0  Trouble concentrating 0 0 0  Moving slowly or fidgety/restless 0 0 0  Suicidal thoughts 0 0 0  PHQ-9 Score 0 0  0      Data saved with a previous flowsheet row definition       12/08/2023   10:30 AM 11/17/2022   11:25 AM 04/05/2020   12:05 PM 05/10/2017   10:38 AM  GAD 7 : Generalized Anxiety Score  Nervous, Anxious, on Edge 0 0 0 0  Control/stop worrying 0 0 0 0  Worry too much - different things 0 0 0 0  Trouble relaxing 0 0 0 0  Restless 0 0 0 2  Easily annoyed or irritable 0 0 0 0  Afraid - awful might happen 0 0 0 0  Total GAD 7 Score 0 0 0 2  Anxiety Difficulty    Not difficult at all      Upstream - 12/08/23 1026       Pregnancy Intention Screening   Does the patient want to become pregnant in the next year? N/A    Does the patient's partner want to become pregnant in the next year? N/A    Would the patient like to discuss contraceptive options today? N/A      Contraception Wrap Up   Current Method Hysterectomy  End Method Hysterectomy    Contraception Counseling Provided No          Examination chaperoned by Clarita Salt LPN  Impression and plan: 1. Encounter for well woman exam with routine gynecological exam (Primary) Physical in 1 year Labs with PCP Mammogram was negative 08/03/23  Colonoscopy per GI   2. Vaginal atrophy Refilled estrace  vaginal cream, she uses pea sized amount that she applies with her finger   3. Lichen sclerosus et atrophicus Will itch occasionally Will refill temovate  Meds ordered this encounter  Medications   clobetasol  cream (TEMOVATE ) 0.05 %    Sig: Use bid x 2 weeks then 2-3 x weekly to affected area    Dispense:  30 g    Refill:  3    Supervising Provider:   JAYNE MINDER H [[id number]]   estradiol  (ESTRACE ) 0.01 % CREA vaginal cream    Sig: Place 1 gm in vagina at bedtime 2-3 x  weekly    Dispense:  42.5 g    Refill:  3    Supervising Provider:   JAYNE, LUTHER H [[id number]]     4. S/P hysterectomy

## 2023-12-09 DIAGNOSIS — Z23 Encounter for immunization: Secondary | ICD-10-CM | POA: Diagnosis not present

## 2023-12-15 DIAGNOSIS — Z299 Encounter for prophylactic measures, unspecified: Secondary | ICD-10-CM | POA: Diagnosis not present

## 2023-12-15 DIAGNOSIS — Z7189 Other specified counseling: Secondary | ICD-10-CM | POA: Diagnosis not present

## 2023-12-15 DIAGNOSIS — E7800 Pure hypercholesterolemia, unspecified: Secondary | ICD-10-CM | POA: Diagnosis not present

## 2023-12-15 DIAGNOSIS — R5383 Other fatigue: Secondary | ICD-10-CM | POA: Diagnosis not present

## 2023-12-15 DIAGNOSIS — Z1331 Encounter for screening for depression: Secondary | ICD-10-CM | POA: Diagnosis not present

## 2023-12-15 DIAGNOSIS — F419 Anxiety disorder, unspecified: Secondary | ICD-10-CM | POA: Diagnosis not present

## 2023-12-15 DIAGNOSIS — Z0000 Encounter for general adult medical examination without abnormal findings: Secondary | ICD-10-CM | POA: Diagnosis not present

## 2023-12-15 DIAGNOSIS — Z79899 Other long term (current) drug therapy: Secondary | ICD-10-CM | POA: Diagnosis not present

## 2023-12-15 DIAGNOSIS — E559 Vitamin D deficiency, unspecified: Secondary | ICD-10-CM | POA: Diagnosis not present

## 2023-12-15 DIAGNOSIS — Z1339 Encounter for screening examination for other mental health and behavioral disorders: Secondary | ICD-10-CM | POA: Diagnosis not present
